# Patient Record
Sex: Female | Born: 1970 | Race: White | Hispanic: No | Marital: Married | Smoking: Never smoker
Health system: Southern US, Community
[De-identification: ages and names within clinical notes are randomized; demographics above are authoritative.]

## PROBLEM LIST (undated history)

## (undated) DIAGNOSIS — E039 Hypothyroidism, unspecified: Secondary | ICD-10-CM

## (undated) DIAGNOSIS — M5126 Other intervertebral disc displacement, lumbar region: Secondary | ICD-10-CM

## (undated) DIAGNOSIS — N189 Chronic kidney disease, unspecified: Secondary | ICD-10-CM

## (undated) DIAGNOSIS — Z87442 Personal history of urinary calculi: Secondary | ICD-10-CM

## (undated) DIAGNOSIS — F32A Depression, unspecified: Secondary | ICD-10-CM

## (undated) DIAGNOSIS — IMO0002 Reserved for concepts with insufficient information to code with codable children: Secondary | ICD-10-CM

## (undated) DIAGNOSIS — F329 Major depressive disorder, single episode, unspecified: Secondary | ICD-10-CM

## (undated) DIAGNOSIS — R002 Palpitations: Principal | ICD-10-CM

## (undated) DIAGNOSIS — F419 Anxiety disorder, unspecified: Secondary | ICD-10-CM

## (undated) DIAGNOSIS — Q600 Renal agenesis, unilateral: Secondary | ICD-10-CM

## (undated) HISTORY — DX: Other intervertebral disc displacement, lumbar region: M51.26

## (undated) HISTORY — DX: Major depressive disorder, single episode, unspecified: F32.9

## (undated) HISTORY — DX: Hypothyroidism, unspecified: E03.9

## (undated) HISTORY — DX: Reserved for concepts with insufficient information to code with codable children: IMO0002

## (undated) HISTORY — DX: Anxiety disorder, unspecified: F41.9

## (undated) HISTORY — DX: Palpitations: R00.2

## (undated) HISTORY — PX: COLONOSCOPY: SHX174

## (undated) HISTORY — DX: Depression, unspecified: F32.A

## (undated) HISTORY — DX: Renal agenesis, unilateral: Q60.0

---

## 2002-07-06 ENCOUNTER — Other Ambulatory Visit: Admission: RE | Admit: 2002-07-06 | Discharge: 2002-07-06 | Payer: Self-pay | Admitting: Obstetrics and Gynecology

## 2002-10-22 ENCOUNTER — Encounter: Payer: Self-pay | Admitting: Obstetrics and Gynecology

## 2002-10-22 ENCOUNTER — Inpatient Hospital Stay (HOSPITAL_COMMUNITY): Admission: AD | Admit: 2002-10-22 | Discharge: 2002-10-22 | Payer: Self-pay | Admitting: Obstetrics and Gynecology

## 2003-01-25 ENCOUNTER — Inpatient Hospital Stay (HOSPITAL_COMMUNITY): Admission: AD | Admit: 2003-01-25 | Discharge: 2003-01-27 | Payer: Self-pay | Admitting: Obstetrics & Gynecology

## 2003-03-06 ENCOUNTER — Other Ambulatory Visit: Admission: RE | Admit: 2003-03-06 | Discharge: 2003-03-06 | Payer: Self-pay | Admitting: Obstetrics and Gynecology

## 2004-04-03 ENCOUNTER — Other Ambulatory Visit: Admission: RE | Admit: 2004-04-03 | Discharge: 2004-04-03 | Payer: Self-pay | Admitting: Obstetrics and Gynecology

## 2005-04-16 ENCOUNTER — Other Ambulatory Visit: Admission: RE | Admit: 2005-04-16 | Discharge: 2005-04-16 | Payer: Self-pay | Admitting: Obstetrics and Gynecology

## 2006-05-24 ENCOUNTER — Encounter: Admission: RE | Admit: 2006-05-24 | Discharge: 2006-05-24 | Payer: Self-pay | Admitting: Obstetrics and Gynecology

## 2006-06-22 ENCOUNTER — Ambulatory Visit (HOSPITAL_COMMUNITY): Admission: RE | Admit: 2006-06-22 | Discharge: 2006-06-22 | Payer: Self-pay | Admitting: Gastroenterology

## 2007-03-08 ENCOUNTER — Encounter: Admission: RE | Admit: 2007-03-08 | Discharge: 2007-03-08 | Payer: Self-pay | Admitting: Obstetrics and Gynecology

## 2008-03-06 ENCOUNTER — Ambulatory Visit (HOSPITAL_COMMUNITY): Admission: RE | Admit: 2008-03-06 | Discharge: 2008-03-06 | Payer: Self-pay | Admitting: Obstetrics and Gynecology

## 2010-10-03 ENCOUNTER — Encounter: Payer: Self-pay | Admitting: Gastroenterology

## 2010-10-04 ENCOUNTER — Encounter: Payer: Self-pay | Admitting: Obstetrics and Gynecology

## 2011-01-26 NOTE — Op Note (Signed)
NAME:  Kristina Sims, Kristina Sims        ACCOUNT NO.:  [id number]   MEDICAL RECORD NO.:  [id number]          PATIENT TYPE:  AMB   LOCATION:  SDC                           FACILITY:  [facility]   PHYSICIAN:  Michelle L. Grewal, M.D.DATE OF BIRTH:  1970/10/01   DATE OF PROCEDURE:  DATE OF DISCHARGE:                               OPERATIVE REPORT   PREOPERATIVE DIAGNOSIS:  Retained intrauterine device.   POSTOPERATIVE DIAGNOSIS:  Retained intrauterine device.   PROCEDURE:  Removal of IUD.   SURGEON:  Michelle L. Vincente Poli, M.D.   ANESTHESIA:  MAC with local.   FINDINGS:  IUD.   PROCEDURE:  The patient was taken to the operating room.  She was  prepped and draped, local was given and paracervical block using 1%  lidocaine without epinephrine.  The cervical internal os was gently  dilated using Pratt dilators.  Polyp forceps were inserted and after the  third attempt, the IUD device was removed.  The strings were coiled  around the device and that is why they were not visualized while in the  office.  All instruments removed from the vagina.  All sponge, lap, and  instrument counts were correct x2.  The patient went to recovery room in  stable condition.      Michelle L. Vincente Poli, M.D.  Electronically Signed     MLG/MEDQ  D:  03/06/2008  T:  03/07/2008  Job:  [id number]

## 2011-06-10 LAB — CBC
HCT: 41.5
Hemoglobin: 14.6
MCHC: 35.1
MCV: 93.2
Platelets: 180
RBC: 4.45
RDW: 12.4
WBC: 5

## 2011-06-10 LAB — PREGNANCY, URINE: Preg Test, Ur: NEGATIVE

## 2013-07-31 ENCOUNTER — Encounter: Payer: Self-pay | Admitting: Gastroenterology

## 2013-08-28 ENCOUNTER — Encounter: Payer: Self-pay | Admitting: Gastroenterology

## 2013-08-28 ENCOUNTER — Ambulatory Visit (INDEPENDENT_AMBULATORY_CARE_PROVIDER_SITE_OTHER): Payer: BC Managed Care – PPO | Admitting: Gastroenterology

## 2013-08-28 VITALS — BP 114/62 | HR 86 | Ht 68.0 in | Wt 141.0 lb

## 2013-08-28 DIAGNOSIS — R141 Gas pain: Secondary | ICD-10-CM

## 2013-08-28 DIAGNOSIS — R140 Abdominal distension (gaseous): Secondary | ICD-10-CM

## 2013-08-28 DIAGNOSIS — R197 Diarrhea, unspecified: Secondary | ICD-10-CM

## 2013-08-28 MED ORDER — MOVIPREP 100 G PO SOLR: 1.0000 | Freq: Once | ORAL | Status: DC

## 2013-08-28 NOTE — Progress Notes (Signed)
HPI: This is a   very pleasant 42 year old woman whom I am meeting for the first time today.  Always since a little girl with GI issues.  Very gassy.  Bloating a lot.  For a very long time.  Tried avoiding dairy in late teens and that made a bit difference. Uses lactaid, other products.  Previously had pyrosis.  After pregnancy with son (16 years ago), more gurgling.  Cramping, loose stools often.  Her PCP suggested gluten free trial (about a year ago).   She had blood work prior to that, was all negative.   Going gluten free has also helped her overall.  At least a sensitivity to gluten clinically.  Can still random attacks of diarrhea, several loose stools in a day.  No sports drinks, no sugar free drinks, no gum or mints.  Sweet tea about once per day.  One mug of coffee.   Very rare nsaids, solitary kidney.  Never blood in her stool.  Jyothi Mann visit in past, eventual [place] which was normal.  Overall stable weight for a long time.  Review of systems: Pertinent positive and negative review of systems were noted in the above HPI section. Complete review of systems was performed and was otherwise normal.    Past Medical History  Diagnosis Date  . Anxiety   . Depression   . Solitary kidney     History reviewed. No pertinent past surgical history.  Current Outpatient Prescriptions  Medication Sig Dispense Refill  . sertraline (ZOLOFT) 100 MG tablet Take 100 mg by mouth daily.       No current facility-administered medications for this visit.    Allergies as of 08/28/2013  . (No Known Allergies)    Family History  Problem Relation Age of Onset  . Diverticulitis Mother     Had resection  . Colon polyps Paternal Grandfather     History   Social History  . Marital Status: Married    Spouse Name: N/A    Number of Children: 2  . Years of Education: N/A   Occupational History  . Research Scientist    Social History Main Topics  . Smoking status: Never  Smoker   . Smokeless tobacco: Never Used  . Alcohol Use: Yes     Comment: glass daily  . Drug Use: No  . Sexual Activity: Not on file   Other Topics Concern  . Not on file   Social History Narrative  . No narrative on file       Physical Exam: BP 114/62  Pulse 86  Ht 5\' 8"  (1.727 m)  Wt 141 lb (63.957 kg)  BMI 21.44 kg/m2  LMP 08/08/2013 Constitutional: generally well-appearing Psychiatric: alert and oriented x3 Eyes: extraocular movements intact Mouth: oral pharynx moist, no lesions Neck: supple no lymphadenopathy Cardiovascular: heart regular rate and rhythm Lungs: clear to auscultation bilaterally Abdomen: soft, nontender, nondistended, no obvious ascites, no peritoneal signs, normal bowel sounds Extremities: no lower extremity edema bilaterally Skin: no lesions on visible extremities    Assessment and plan: 42 y.o. female with  significant bloating, intermittent diarrhea, abdominal discomfort, clinical celiac sprue or at least gluten intolerance, clinical lactose intolerance  She has a lot of bloating. Very bothersome to her now it like to proceed with EGD at her soonest convenience to rule out H. pylori, significant gastritis, we'll also proceed with duodenal biopsies have recommended she consider eating some gluten between now and then to decrease the chance of false negative biopsies.  At the same time are proceed with an colonoscopy given her intermittent diarrhea. We'll get labs sent over from her gynecologist for review.

## 2013-08-28 NOTE — Patient Instructions (Signed)
You will be set up for an upper endoscopy for bloating, dyspepsia, biopsy of duodenum to check for celiac. You will be set up for a colonoscopy for loose stools (LEC, moderate sedation). Try imodium one pill as needed. We will get records of recent labs from Dr. Thana Ates (any recent labs). Consider eating a little gluten before your EGD to decrease chance of false negative.

## 2013-08-29 ENCOUNTER — Encounter: Payer: Self-pay | Admitting: Gastroenterology

## 2013-08-31 ENCOUNTER — Telehealth: Payer: Self-pay | Admitting: Gastroenterology

## 2013-08-31 NOTE — Telephone Encounter (Signed)
Labs from Dr. Vincente Poli: 07/2013: thyroid tests, cmet all normal.   Please call her about the above.  Should continue with the suggestions outlined at recent visit.

## 2013-08-31 NOTE — Telephone Encounter (Signed)
The patient has been notified of this information and all questions answered.

## 2013-09-13 HISTORY — PX: BACK SURGERY: SHX140

## 2013-10-15 ENCOUNTER — Encounter: Payer: BC Managed Care – PPO | Admitting: Gastroenterology

## 2013-10-15 ENCOUNTER — Telehealth: Payer: Self-pay | Admitting: Gastroenterology

## 2013-10-15 NOTE — Telephone Encounter (Signed)
Spoke with patient and she has the flu with a temp of 101.   Greer EeShelia Tellis, RN told Dr. Christella HartiganJacobs.  Patient stated that she will reschedule when she feels better.

## 2013-12-05 ENCOUNTER — Encounter: Payer: BC Managed Care – PPO | Admitting: Gastroenterology

## 2013-12-26 ENCOUNTER — Encounter: Payer: BC Managed Care – PPO | Admitting: Gastroenterology

## 2014-01-28 ENCOUNTER — Telehealth: Payer: Self-pay | Admitting: Gastroenterology

## 2014-01-28 NOTE — Telephone Encounter (Signed)
Left message for pt to call back  °

## 2014-01-28 NOTE — Telephone Encounter (Signed)
Spoke with pt and discussed with her that she should be fine for her procedure since the injection is this week.

## 2014-02-06 ENCOUNTER — Encounter: Payer: Self-pay | Admitting: Gastroenterology

## 2014-02-06 ENCOUNTER — Ambulatory Visit (AMBULATORY_SURGERY_CENTER): Payer: BC Managed Care – PPO | Admitting: Gastroenterology

## 2014-02-06 VITALS — BP 103/66 | HR 66 | Temp 97.7°F | Resp 21 | Ht 68.0 in | Wt 141.0 lb

## 2014-02-06 DIAGNOSIS — R142 Eructation: Principal | ICD-10-CM

## 2014-02-06 DIAGNOSIS — R141 Gas pain: Secondary | ICD-10-CM

## 2014-02-06 DIAGNOSIS — R197 Diarrhea, unspecified: Secondary | ICD-10-CM

## 2014-02-06 DIAGNOSIS — K2990 Gastroduodenitis, unspecified, without bleeding: Secondary | ICD-10-CM

## 2014-02-06 DIAGNOSIS — K2970 Gastritis, unspecified, without bleeding: Secondary | ICD-10-CM

## 2014-02-06 DIAGNOSIS — R143 Flatulence: Principal | ICD-10-CM

## 2014-02-06 MED ORDER — SODIUM CHLORIDE 0.9 % IV SOLN: 500.0000 mL | INTRAVENOUS | Status: DC

## 2014-02-06 NOTE — Op Note (Signed)
[facility] [address] [place], [address]   ENDOSCOPY PROCEDURE REPORT  PATIENT: Kristina, Sims  MR#: [id number] BIRTHDATE: 11-05-1970 , 43  yrs. old GENDER: Female ENDOSCOPIST: Rachael Fee, MD PROCEDURE DATE:  02/06/2014 PROCEDURE:  EGD w/ biopsy ASA CLASS:     Class II INDICATIONS:  bloating, abdominal discomfort. MEDICATIONS: MAC sedation, administered by CRNA and propofol (Diprivan) 200mg  IV TOPICAL ANESTHETIC: none  DESCRIPTION OF PROCEDURE: After the risks benefits and alternatives of the procedure were thoroughly explained, informed consent was obtained.  The LB [id number] endoscope was introduced through the mouth and advanced to the second portion of the duodenum. Without limitations.  The instrument was slowly withdrawn as the mucosa was fully examined.    There was mild, non-specific distal gastritis.  This was biopsied and sent to pathology.  The examination was otherwise normal. Biopsies were taken from duodenum and sent to pathology. Retroflexed views revealed no abnormalities.     The scope was then withdrawn from the patient and the procedure completed. COMPLICATIONS: There were no complications.  ENDOSCOPIC IMPRESSION: There was mild, non-specific distal gastritis.  This was biopsied and sent to pathology.  The examination was otherwise normal. Biopsies were taken from duodenum and sent to pathology.  RECOMMENDATIONS: Await biopsies from stomach and from duodenum.   eSigned:  Rachael Fee, MD 02/06/2014 2:03 PM   CC: Selena Batten, MD

## 2014-02-06 NOTE — Patient Instructions (Signed)
Discharge instructions given with verbal understanding. Biopsies taken. Resume previous medications. YOU HAD AN ENDOSCOPIC PROCEDURE TODAY AT THE [facility]: Refer to the procedure report that was given to you for any specific questions about what was found during the examination.  If the procedure report does not answer your questions, please call your gastroenterologist to clarify.  If you requested that your care partner not be given the details of your procedure findings, then the procedure report has been included in a sealed envelope for you to review at your convenience later.  YOU SHOULD EXPECT: Some feelings of bloating in the abdomen. Passage of more gas than usual.  Walking can help get rid of the air that was put into your GI tract during the procedure and reduce the bloating. If you had a lower endoscopy (such as a colonoscopy or flexible sigmoidoscopy) you may notice spotting of blood in your stool or on the toilet paper. If you underwent a bowel prep for your procedure, then you may not have a normal bowel movement for a few days.  DIET: Your first meal following the procedure should be a light meal and then it is ok to progress to your normal diet.  A half-sandwich or bowl of soup is an example of a good first meal.  Heavy or fried foods are harder to digest and may make you feel nauseous or bloated.  Likewise meals heavy in dairy and vegetables can cause extra gas to form and this can also increase the bloating.  Drink plenty of fluids but you should avoid alcoholic beverages for 24 hours.  ACTIVITY: Your care partner should take you home directly after the procedure.  You should plan to take it easy, moving slowly for the rest of the day.  You can resume normal activity the day after the procedure however you should NOT DRIVE or use heavy machinery for 24 hours (because of the sedation medicines used during the test).    SYMPTOMS TO REPORT IMMEDIATELY: A gastroenterologist  can be reached at any hour.  During normal business hours, 8:30 AM to 5:00 PM Monday through Friday, call [phone].  After hours and on weekends, please call the GI answering service at [phone] who will take a message and have the physician on call contact you.   Following lower endoscopy (colonoscopy or flexible sigmoidoscopy):  Excessive amounts of blood in the stool  Significant tenderness or worsening of abdominal pains  Swelling of the abdomen that is new, acute  Fever of 100F or higher  Following upper endoscopy (EGD)  Vomiting of blood or coffee ground material  New chest pain or pain under the shoulder blades  Painful or persistently difficult swallowing  New shortness of breath  Fever of 100F or higher  Black, tarry-looking stools  FOLLOW UP: If any biopsies were taken you will be contacted by phone or by letter within the next 1-3 weeks.  Call your gastroenterologist if you have not heard about the biopsies in 3 weeks.  Our staff will call the home number listed on your records the next business day following your procedure to check on you and address any questions or concerns that you may have at that time regarding the information given to you following your procedure. This is a courtesy call and so if there is no answer at the home number and we have not heard from you through the emergency physician on call, we will assume that you have returned to your   regular daily activities without incident.  SIGNATURES/CONFIDENTIALITY: You and/or your care partner have signed paperwork which will be entered into your electronic medical record.  These signatures attest to the fact that that the information above on your After Visit Summary has been reviewed and is understood.  Full responsibility of the confidentiality of this discharge information lies with you and/or your care-partner. 

## 2014-02-06 NOTE — Progress Notes (Signed)
Called to room to assist during endoscopic procedure.  Patient ID and intended procedure confirmed with present staff. Received instructions for my participation in the procedure from the performing physician.  

## 2014-02-06 NOTE — Progress Notes (Signed)
Pt stable to RR 

## 2014-02-06 NOTE — Op Note (Signed)
[facility] [address] [place], [address]   COLONOSCOPY PROCEDURE REPORT  PATIENT: Kristina Sims, Kristina Sims  MR#: [id number] BIRTHDATE: Oct 20, 1970 , 43  yrs. old GENDER: Female ENDOSCOPIST: Rachael Fee, MD REFERRED UP:[facility] Corliss Blacker, M.D. PROCEDURE DATE:  02/06/2014 PROCEDURE:   Colonoscopy with biopsy First Screening Colonoscopy - Avg.  risk and is 50 yrs.  old or older - No.  Prior Negative Screening - Now for repeat screening. N/A  History of Adenoma - Now for follow-up colonoscopy & has been > or = to 3 yrs.  N/A  Polyps Removed Today? No.  Recommend repeat exam, <10 yrs? No. ASA CLASS:   Class II INDICATIONS:intermittent diarrhea. MEDICATIONS: MAC sedation, administered by CRNA and propofol (Diprivan) 250mg  IV  DESCRIPTION OF PROCEDURE:   After the risks benefits and alternatives of the procedure were thoroughly explained, informed consent was obtained.  A digital rectal exam revealed no abnormalities of the rectum.   The LB [id number]  endoscope was introduced through the anus and advanced to the terminal ileum which was intubated for a short distance. No adverse events experienced.   The quality of the prep was excellent.  The instrument was then slowly withdrawn as the colon was fully examined.   COLON FINDINGS: The mucosa appeared normal in the terminal ileum. A normal appearing cecum, ileocecal valve, and appendiceal orifice were identified.  The ascending, hepatic flexure, transverse, splenic flexure, descending, sigmoid colon and rectum appeared unremarkable.  No polyps or cancers were seen.  Retroflexed views revealed no abnormalities. The colon was randomly biopsied.The time to cecum=7 minutes 04 seconds.  Withdrawal time=6 minutes 18 seconds.  The scope was withdrawn and the procedure completed. COMPLICATIONS: There were no complications.  ENDOSCOPIC IMPRESSION: 1.   Normal mucosa in the terminal ileum 2.   Normal colon, randomly  biopsied to check for microscopic colitis.  RECOMMENDATIONS: Await final pathology   eSigned:  Rachael Fee, MD 02/06/2014 1:54 PM

## 2014-02-07 ENCOUNTER — Telehealth: Payer: Self-pay | Admitting: *Deleted

## 2014-02-07 NOTE — Telephone Encounter (Signed)
  Follow up Call-  Call back number 02/06/2014  Post procedure Call Back phone  # [phone]  Permission to leave phone message Yes     Patient questions:  Do you have a fever, pain , or abdominal swelling? no Pain Score  0 *  Have you tolerated food without any problems? yes  Have you been able to return to your normal activities? yes  Do you have any questions about your discharge instructions: Diet   no Medications  no Follow up visit  no  Do you have questions or concerns about your Care? no  Actions: * If pain score is 4 or above: No action needed, pain <4.

## 2014-02-12 ENCOUNTER — Encounter: Payer: Self-pay | Admitting: Gastroenterology

## 2014-11-08 ENCOUNTER — Other Ambulatory Visit: Payer: Self-pay | Admitting: Obstetrics and Gynecology

## 2014-11-12 LAB — CYTOLOGY - PAP

## 2015-09-14 DIAGNOSIS — I499 Cardiac arrhythmia, unspecified: Secondary | ICD-10-CM

## 2015-09-14 HISTORY — DX: Cardiac arrhythmia, unspecified: I49.9

## 2015-11-12 ENCOUNTER — Telehealth: Payer: Self-pay | Admitting: Cardiovascular Disease

## 2015-11-12 NOTE — Telephone Encounter (Signed)
Received records from [facility] for appointment on 11/27/15 with Dr Duke Salvia. Records given to [facility] (medical records) for Dr Leonides Sake schedule on 11/27/15.  lp

## 2015-11-27 ENCOUNTER — Encounter: Payer: Self-pay | Admitting: Cardiovascular Disease

## 2015-11-27 ENCOUNTER — Ambulatory Visit (INDEPENDENT_AMBULATORY_CARE_PROVIDER_SITE_OTHER): Payer: BC Managed Care – PPO | Admitting: Cardiovascular Disease

## 2015-11-27 VITALS — BP 110/80 | HR 74 | Ht 68.5 in | Wt 140.3 lb

## 2015-11-27 DIAGNOSIS — R002 Palpitations: Secondary | ICD-10-CM | POA: Insufficient documentation

## 2015-11-27 DIAGNOSIS — E038 Other specified hypothyroidism: Secondary | ICD-10-CM | POA: Diagnosis not present

## 2015-11-27 DIAGNOSIS — E039 Hypothyroidism, unspecified: Secondary | ICD-10-CM | POA: Insufficient documentation

## 2015-11-27 HISTORY — DX: Hypothyroidism, unspecified: E03.9

## 2015-11-27 HISTORY — DX: Palpitations: R00.2

## 2015-11-27 LAB — MAGNESIUM: Magnesium: 2 mg/dL (ref 1.5–2.5)

## 2015-11-27 LAB — BASIC METABOLIC PANEL
BUN: 15 mg/dL (ref 7–25)
CO2: 24 mmol/L (ref 20–31)
Calcium: 9.4 mg/dL (ref 8.6–10.2)
Chloride: 105 mmol/L (ref 98–110)
Creat: 0.91 mg/dL (ref 0.50–1.10)
Glucose, Bld: 86 mg/dL (ref 65–99)
Potassium: 4.6 mmol/L (ref 3.5–5.3)
Sodium: 140 mmol/L (ref 135–146)

## 2015-11-27 NOTE — Patient Instructions (Addendum)
Medication Instructions:  Your physician recommends that you continue on your current medications as directed. Please refer to the Current Medication list given to you today.  [facility]  Testing/Procedures: none  Follow-Up: Your physician wants you to follow-up in: 1 year ov You will receive a reminder letter in the mail two months in advance. If you don't receive a letter, please call our office to schedule the follow-up appointment.  Any Other Special Instructions Will Be Listed Below (If Applicable). Call the office if your palpitations become more frequent and we will schedule you an cardiac event monitor   Your physician has recommended that you wear an event monitor. Event monitors are medical devices that record the heart's electrical activity. Doctors most often us these monitors to diagnose arrhythmias. Arrhythmias are problems with the speed or rhythm of the heartbeat. The monitor is a small, portable device. You can wear one while you do your normal daily activities. This is usually used to diagnose what is causing palpitations/syncope (passing out).  If you need a refill on your cardiac medications before your next appointment, please call your pharmacy.

## 2015-11-27 NOTE — Progress Notes (Signed)
Cardiology Office Note   Date:  11/27/2015   ID:  [name], DOB November 08, 1970, MRN [id number]  PCP:  Gweneth Dimitri, MD  Cardiologist:   Madilyn Hook, MD   Chief Complaint  Patient presents with  . New Evaluation    feels as if heart is "skipping beats"      History of Present Illness: Kristina Sims is a 45 y.o. female with hypothyroidism, anxiety and depression who presents for an evaluation of palpitations.  Kristina Sims saw her PCP, Dr. Maurice Small, on 10/22/15.  At that appointment she reported daily palpitations.  At that appointment her thyroid function was normal and she was not anemic.  EKG showed sinus rhythm.  She reports episodes  of palpitations that started this summer.  They occur sporadically but tend to occur more frequently at night when she lays down in the bed. They do not occur with exertion. She gets short of breath when happens but denies any associated chest pain, lightheadedness, dizziness, nausea or diaphoresis. She notes that she has been under more stress lately as her son, father in law, and husband all have polycystic kidney disease. Her husband will likely require a kidney transplant soon. Her father was also diagnosed with atrial fibrillation this summer and her mother has dementia. She is concerned that she too may have atrial fibrillation as her father recounts having episodes of palpitations for years but just now became sustained.   Kristina Sims has tried to reduce her caffeine intake. She thinks that this may have helped, and she is not having symptoms as frequently anymore. The last episode was 2 weeks ago. This summer she was having them almost daily and she had more frequent episodes over the New Year's holiday.  Exercises 4 times a week either using the elliptical or in a spin class. She  does not have any exertional symptoms. She has not noted any lower extremity edema, orthopnea or PND. She does note some left  supraclavicular chest pain that never occurs with exertion. She thinks it is related to musculoskeletal tenderness from exercise.  She reports having a healthy diet and her cholesterol levels were normal when checked with her OB/GYN recently.   Past Medical History  Diagnosis Date  . Anxiety   . Depression   . Solitary kidney   . RLD (ruptured lumbar disc)   . Palpitations 11/27/2015  . Hypothyroidism 11/27/2015    No past surgical history on file.   Current Outpatient Prescriptions  Medication Sig Dispense Refill  . sertraline (ZOLOFT) 100 MG tablet Take 100 mg by mouth daily.     No current facility-administered medications for this visit.    Allergies:   Review of patient's allergies indicates no known allergies.    Social History:  The patient  reports that she has never smoked. She has never used smokeless tobacco. She reports that she drinks alcohol. She reports that she does not use illicit drugs.   Family History:  The patient's family history includes Atrial fibrillation in her father; Colon polyps in her paternal grandfather; Dementia in her mother; Diverticulitis in her mother.    ROS:  Please see the history of present illness.   Otherwise, review of systems are positive for none.   All other systems are reviewed and negative.    PHYSICAL EXAM: VS:  BP 110/80 mmHg  Pulse 74  Ht 5' 8.5" (1.74 m)  Wt 63.64 kg (140 lb 4.8 oz)  BMI 21.02 kg/m2 , BMI Body  mass index is 21.02 kg/(m^2). GENERAL:  Well appearing HEENT:  Pupils equal round and reactive, fundi not visualized, oral mucosa unremarkable NECK:  No jugular venous distention, waveform within normal limits, carotid upstroke brisk and symmetric, no bruits, no thyromegaly LYMPHATICS:  No cervical adenopathy LUNGS:  Clear to auscultation bilaterally HEART:  RRR.  PMI not displaced or sustained,S1 and S2 within normal limits, no S3, no S4, no clicks, no rubs, no murmurs ABD:  Flat, positive bowel sounds normal in  frequency in pitch, no bruits, no rebound, no guarding, no midline pulsatile mass, no hepatomegaly, no splenomegaly EXT:  2 plus pulses throughout, no edema, no cyanosis no clubbing SKIN:  No rashes no nodules NEURO:  Cranial nerves II through XII grossly intact, motor grossly intact throughout PSYCH:  Cognitively intact, oriented to person place and time    EKG:  EKG is ordered today. The ekg ordered today demonstratessinus rhythm. Rate 74 bpm.  Recent Labs: No results found for requested labs within last 365 days.   10/22/15:  TSH 0.74 Hgb 13.0   Lipid Panel No results found for: CHOL, TRIG, HDL, CHOLHDL, VLDL, LDLCALC, LDLDIRECT    Wt Readings from Last 3 Encounters:  11/27/15 63.64 kg (140 lb 4.8 oz)  02/06/14 63.957 kg (141 lb)  08/28/13 63.957 kg (141 lb)      ASSESSMENT AND PLAN:  # Palpitations: Kristina Sims's palpitations are likely PVCs or PACs. However, given her family history of atrial fibrillation that may have started at a young age, this is possible too. She is not having them frequently now. She prefers to wear an ambulatory monitor when the episodes are occurring more frequently. She will continue to limit her caffeine intake as this seems to have improved her symptoms. Hemoglobin and thyroid function were within normal limits. We will check a basic metabolic panel and a magnesium level to ensure her electrolytes are stable.    Current medicines are reviewed at length with the patient today.  The patient does not have concerns regarding medicines.  The following changes have been made:  no change  Labs/ tests ordered today include:   Orders Placed This Encounter  Procedures  . Basic metabolic panel  . Magnesium  . EKG 12-Lead     Disposition:   FU with Ivyonna Hoelzel C. Duke Salviaandolph, MD, [facility] in 1 year    This note was written with the assistance of speech recognition software.  Please excuse any transcriptional errors.  Signed, Shanan Mcmiller C. Duke Salviaandolph,  MD, [facility]  11/27/2015 9:16 AM    [facility]

## 2015-12-08 ENCOUNTER — Telehealth: Payer: Self-pay | Admitting: Cardiovascular Disease

## 2015-12-08 NOTE — Telephone Encounter (Signed)
F/u  Pt returning RN phone call. Please call back and discuss.   

## 2015-12-08 NOTE — Telephone Encounter (Signed)
Lab results reported, pt verbalized understanding.

## 2016-03-29 ENCOUNTER — Telehealth: Payer: Self-pay | Admitting: Cardiovascular Disease

## 2016-03-29 DIAGNOSIS — R002 Palpitations: Secondary | ICD-10-CM

## 2016-03-29 NOTE — Telephone Encounter (Signed)
Spoke with patient and scheduled event monitor for [facility] Patient aware of date and time

## 2016-03-29 NOTE — Telephone Encounter (Signed)
New message      The pt starting having a fast heart and was instructed to call and come by the office and the office would place the pt on a heart monitor.

## 2016-03-30 ENCOUNTER — Ambulatory Visit (INDEPENDENT_AMBULATORY_CARE_PROVIDER_SITE_OTHER): Payer: BC Managed Care – PPO

## 2016-03-30 DIAGNOSIS — R002 Palpitations: Secondary | ICD-10-CM

## 2016-05-24 ENCOUNTER — Telehealth: Payer: Self-pay | Admitting: Cardiovascular Disease

## 2016-05-24 NOTE — Telephone Encounter (Signed)
Returning your call from Thursday. °

## 2016-05-26 NOTE — Telephone Encounter (Signed)
Left message to call back  Did leave detailed message last week   Notes Recorded by Chilton Siiffany [place], MD on 05/19/2016 at 5:33 PM EDT Normal monitor

## 2016-05-26 NOTE — Telephone Encounter (Signed)
Advised patient of results.  

## 2017-06-15 ENCOUNTER — Other Ambulatory Visit: Payer: Self-pay | Admitting: Family Medicine

## 2017-06-15 DIAGNOSIS — R1011 Right upper quadrant pain: Secondary | ICD-10-CM

## 2017-06-21 ENCOUNTER — Ambulatory Visit
Admission: RE | Admit: 2017-06-21 | Discharge: 2017-06-21 | Disposition: A | Discharge status: Home / Self Care | Payer: BC Managed Care – PPO | Source: Ambulatory Visit | Attending: Family Medicine | Admitting: Family Medicine

## 2017-06-21 DIAGNOSIS — R1011 Right upper quadrant pain: Secondary | ICD-10-CM

## 2019-11-23 ENCOUNTER — Ambulatory Visit: Payer: BC Managed Care – PPO | Attending: Internal Medicine

## 2019-11-23 DIAGNOSIS — Z23 Encounter for immunization: Secondary | ICD-10-CM

## 2019-11-23 NOTE — Progress Notes (Signed)
   Covid-19 Vaccination Clinic  Name:  Kristina Sims    MRN: [id number] DOB: 1970/10/29  11/23/2019  Kristina Sims was observed post Covid-19 immunization for 15 minutes without incident. She was provided with Vaccine Information Sheet and instruction to access the V-Safe system.   Kristina Sims was instructed to call 911 with any severe reactions post vaccine: Marland Kitchen Difficulty breathing  . Swelling of face and throat  . A fast heartbeat  . A bad rash all over body  . Dizziness and weakness   Immunizations Administered    Name Date Dose VIS Date Route   Pfizer COVID-19 Vaccine 11/23/2019  4:33 PM 0.3 mL 08/24/2019 Intramuscular   Manufacturer: [employer], [employer]   Lot: [id number]   NDC: [address]-5

## 2019-12-17 ENCOUNTER — Ambulatory Visit: Payer: BC Managed Care – PPO

## 2019-12-17 ENCOUNTER — Ambulatory Visit: Payer: BC Managed Care – PPO | Attending: Internal Medicine

## 2019-12-17 DIAGNOSIS — Z23 Encounter for immunization: Secondary | ICD-10-CM

## 2019-12-17 NOTE — Progress Notes (Signed)
   Covid-19 Vaccination Clinic  Name:  Kristina Sims    MRN: [id number] DOB: 12/08/1970  12/17/2019  [name] was observed post Covid-19 immunization for 15 minutes without incident. She was provided with Vaccine Information Sheet and instruction to access the V-Safe system.   [name] was instructed to call 911 with any severe reactions post vaccine: Marland Kitchen Difficulty breathing  . Swelling of face and throat  . A fast heartbeat  . A bad rash all over body  . Dizziness and weakness   Immunizations Administered    Name Date Dose VIS Date Route   Pfizer COVID-19 Vaccine 12/17/2019 12:55 PM 0.3 mL 08/24/2019 Intramuscular   Manufacturer: [employer], [employer]   Lot: [id number]   NDC: [address]-2

## 2020-12-04 ENCOUNTER — Emergency Department (HOSPITAL_BASED_OUTPATIENT_CLINIC_OR_DEPARTMENT_OTHER): Payer: BLUE CROSS/BLUE SHIELD

## 2020-12-04 ENCOUNTER — Emergency Department (HOSPITAL_BASED_OUTPATIENT_CLINIC_OR_DEPARTMENT_OTHER)
Admission: EM | Admit: 2020-12-04 | Discharge: 2020-12-04 | Disposition: A | Discharge status: Home / Self Care | Payer: BLUE CROSS/BLUE SHIELD | Attending: Emergency Medicine | Admitting: Emergency Medicine

## 2020-12-04 ENCOUNTER — Encounter (HOSPITAL_BASED_OUTPATIENT_CLINIC_OR_DEPARTMENT_OTHER): Payer: Self-pay

## 2020-12-04 ENCOUNTER — Other Ambulatory Visit: Payer: Self-pay

## 2020-12-04 DIAGNOSIS — E039 Hypothyroidism, unspecified: Secondary | ICD-10-CM | POA: Insufficient documentation

## 2020-12-04 DIAGNOSIS — R109 Unspecified abdominal pain: Secondary | ICD-10-CM | POA: Diagnosis present

## 2020-12-04 DIAGNOSIS — N201 Calculus of ureter: Secondary | ICD-10-CM | POA: Diagnosis not present

## 2020-12-04 LAB — COMPREHENSIVE METABOLIC PANEL
ALT: 12 U/L (ref 0–44)
AST: 16 U/L (ref 15–41)
Albumin: 4.5 g/dL (ref 3.5–5.0)
Alkaline Phosphatase: 47 U/L (ref 38–126)
Anion gap: 8 (ref 5–15)
BUN: 11 mg/dL (ref 6–20)
CO2: 26 mmol/L (ref 22–32)
Calcium: 9.1 mg/dL (ref 8.9–10.3)
Chloride: 105 mmol/L (ref 98–111)
Creatinine, Ser: 0.85 mg/dL (ref 0.44–1.00)
GFR, Estimated: >60 mL/min (ref >60)
Glucose, Bld: 89 mg/dL (ref 70–99)
Potassium: 3.7 mmol/L (ref 3.5–5.1)
Sodium: 139 mmol/L (ref 135–145)
Total Bilirubin: 0.7 mg/dL (ref 0.3–1.2)
Total Protein: 6.6 g/dL (ref 6.5–8.1)

## 2020-12-04 LAB — CBC WITH DIFFERENTIAL/PLATELET
Abs Immature Granulocytes: 0.01 10*3/uL (ref 0.00–0.07)
Basophils Absolute: 0.1 10*3/uL (ref 0.0–0.1)
Basophils Relative: 1 %
Eosinophils Absolute: 0 10*3/uL (ref 0.0–0.5)
Eosinophils Relative: 0 %
HCT: 42.9 % (ref 36.0–46.0)
Hemoglobin: 14.2 g/dL (ref 12.0–15.0)
Immature Granulocytes: 0 %
Lymphocytes Relative: 25 %
Lymphs Abs: 1.7 10*3/uL (ref 0.7–4.0)
MCH: 32.6 pg (ref 26.0–34.0)
MCHC: 33.1 g/dL (ref 30.0–36.0)
MCV: 98.4 fL (ref 80.0–100.0)
Monocytes Absolute: 0.6 10*3/uL (ref 0.1–1.0)
Monocytes Relative: 8 %
Neutro Abs: 4.7 10*3/uL (ref 1.7–7.7)
Neutrophils Relative %: 66 %
Platelets: 257 10*3/uL (ref 150–400)
RBC: 4.36 MIL/uL (ref 3.87–5.11)
RDW: 12.3 % (ref 11.5–15.5)
WBC: 7 10*3/uL (ref 4.0–10.5)
nRBC: 0 % (ref 0.0–0.2)

## 2020-12-04 LAB — URINALYSIS, ROUTINE W REFLEX MICROSCOPIC
Bilirubin Urine: NEGATIVE
Glucose, UA: NEGATIVE mg/dL
Ketones, ur: NEGATIVE mg/dL
Leukocytes,Ua: NEGATIVE
Nitrite: NEGATIVE
Protein, ur: NEGATIVE mg/dL
RBC / HPF: >50 RBC/hpf — ABNORMAL HIGH (ref 0–5)
Specific Gravity, Urine: 1.002 — ABNORMAL LOW (ref 1.005–1.030)
pH: 7 (ref 5.0–8.0)

## 2020-12-04 LAB — PREGNANCY, URINE: Preg Test, Ur: NEGATIVE

## 2020-12-04 LAB — LIPASE, BLOOD: Lipase: 33 U/L (ref 11–51)

## 2020-12-04 MED ORDER — ONDANSETRON 4 MG PO TBDP: 4.0000 mg | ORAL_TABLET | Freq: Three times a day (TID) | ORAL | 0 refills | Status: AC | PRN

## 2020-12-04 MED ORDER — KETOROLAC TROMETHAMINE 15 MG/ML IJ SOLN
15.0000 mg | Freq: Once | INTRAMUSCULAR | Status: AC
  Administered 2020-12-04: 15 mg via INTRAVENOUS
  Filled 2020-12-04: qty 1

## 2020-12-04 MED ORDER — TAMSULOSIN HCL 0.4 MG PO CAPS: 0.4000 mg | ORAL_CAPSULE | Freq: Every day | ORAL | 0 refills | Status: AC

## 2020-12-04 MED ORDER — OXYCODONE-ACETAMINOPHEN 5-325 MG PO TABS: 1.0000 | ORAL_TABLET | Freq: Four times a day (QID) | ORAL | 0 refills | Status: AC | PRN

## 2020-12-04 MED ORDER — LACTATED RINGERS IV BOLUS
1000.0000 mL | Freq: Once | INTRAVENOUS | Status: AC
  Administered 2020-12-04: 1000 mL via INTRAVENOUS

## 2020-12-04 NOTE — ED Provider Notes (Signed)
[facility] EMERGENCY DEPT Provider Note   CSN: [id number] Arrival date & time: 12/04/20  1219     History Chief Complaint  Patient presents with  . Flank Pain    Left    Kristina Sims is a 50 y.o. female.   Flank Pain This is a recurrent problem. The current episode started 3 to 5 hours ago. The problem occurs constantly. The problem has not changed since onset.Associated symptoms include abdominal pain (cva). Pertinent negatives include no chest pain, no headaches and no shortness of breath. Nothing aggravates the symptoms. Nothing relieves the symptoms. She has tried nothing for the symptoms. The treatment provided no relief.       Past Medical History:  Diagnosis Date  . Anxiety   . Depression   . Hypothyroidism 11/27/2015  . Palpitations 11/27/2015  . RLD (ruptured lumbar disc)   . Solitary kidney     Patient Active Problem List   Diagnosis Date Noted  . Palpitations 11/27/2015  . Hypothyroidism 11/27/2015    History reviewed. No pertinent surgical history.   OB History   No obstetric history on file.     Family History  Problem Relation Age of Onset  . Diverticulitis Mother        Had resection  . Dementia Mother   . Bradycardia Mother   . Colon polyps Paternal Grandfather   . Atrial fibrillation Father   . Thyroid disease Father   . Peripheral Artery Disease Maternal Grandmother   . Alzheimer's disease Maternal Grandfather     Social History   Tobacco Use  . Smoking status: Never Smoker  . Smokeless tobacco: Never Used  Substance Use Topics  . Alcohol use: Yes    Comment: glass daily  . Drug use: No    Home Medications Prior to Admission medications   Medication Sig Start Date End Date Taking? Authorizing Provider  ondansetron (ZOFRAN ODT) 4 MG disintegrating tablet Take 1 tablet (4 mg total) by mouth every 8 (eight) hours as needed for up to 10 doses for nausea or vomiting. 12/04/20  Yes Sabino Donovan, MD   oxyCODONE-acetaminophen (PERCOCET/ROXICET) 5-325 MG tablet Take 1 tablet by mouth every 6 (six) hours as needed for up to 12 days for severe pain. 12/04/20 12/16/20 Yes Sabino Donovan, MD  tamsulosin (FLOMAX) 0.4 MG CAPS capsule Take 1 capsule (0.4 mg total) by mouth daily for 5 days. 12/04/20 12/09/20 Yes Sabino Donovan, MD  estradiol (VIVELLE-DOT) 0.05 MG/24HR patch Place 1 patch onto the skin 2 (two) times a week. 11/30/20   [provider]  progesterone (PROMETRIUM) 100 MG capsule Take 200 mg by mouth daily. 10/22/20   [provider]  sertraline (ZOLOFT) 100 MG tablet Take 100 mg by mouth daily.    [provider]    Allergies    Patient has no known allergies.  Review of Systems   Review of Systems  Respiratory: Negative for shortness of breath.   Cardiovascular: Negative for chest pain.  Gastrointestinal: Positive for abdominal pain (cva).  Genitourinary: Positive for flank pain and hematuria. Negative for difficulty urinating, dysuria and enuresis.  Neurological: Negative for headaches.    Physical Exam Updated Vital Signs BP 111/68   Pulse 75   Temp 98.1 F (36.7 C) (Oral)   Resp 15   Ht 5\' 8"  (1.727 m)   Wt 65.8 kg   SpO2 100%   BMI 22.05 kg/m   Physical Exam Vitals and nursing note reviewed. Exam conducted with  a chaperone present.  Constitutional:      General: She is not in acute distress.    Appearance: Normal appearance.  HENT:     Head: Normocephalic and atraumatic.     Nose: No rhinorrhea.     Mouth/Throat:     Mouth: Mucous membranes are moist.  Eyes:     General:        Right eye: No discharge.        Left eye: No discharge.     Conjunctiva/sclera: Conjunctivae normal.  Cardiovascular:     Rate and Rhythm: Normal rate and regular rhythm.  Pulmonary:     Effort: Pulmonary effort is normal. No respiratory distress.     Breath sounds: No stridor.  Abdominal:     General: Abdomen is flat. There is no distension.     Palpations:  Abdomen is soft.     Tenderness: There is no abdominal tenderness. There is no right CVA tenderness, left CVA tenderness, guarding or rebound.  Musculoskeletal:        General: No tenderness or signs of injury.  Skin:    General: Skin is warm and dry.     Capillary Refill: Capillary refill takes less than 2 seconds.  Neurological:     General: No focal deficit present.     Mental Status: She is alert. Mental status is at baseline.     Motor: No weakness.  Psychiatric:        Mood and Affect: Mood normal.        Behavior: Behavior normal.     ED Results / Procedures / Treatments   Labs (all labs ordered are listed, but only abnormal results are displayed) Labs Reviewed  URINALYSIS, ROUTINE W REFLEX MICROSCOPIC - Abnormal; Notable for the following components:      Result Value   Color, Urine COLORLESS (*)    Specific Gravity, Urine 1.002 (*)    Hgb urine dipstick LARGE (*)    RBC / HPF >50 (*)    Bacteria, UA RARE (*)    All other components within normal limits  CBC WITH DIFFERENTIAL/PLATELET  COMPREHENSIVE METABOLIC PANEL  LIPASE, BLOOD  PREGNANCY, URINE    EKG None  Radiology CT Renal Stone Study  Result Date: 12/04/2020 CLINICAL DATA:  Left flank pain EXAM: CT ABDOMEN AND PELVIS WITHOUT CONTRAST TECHNIQUE: Multidetector CT imaging of the abdomen and pelvis was performed following the standard protocol without oral or IV contrast. COMPARISON:  None. FINDINGS: Lower chest: Lung bases are clear. Hepatobiliary: Liver measures 20.5 cm in length. 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 No focal liver lesions are evident on this noncontrast enhanced study. Gallbladder wall is not appreciably thickened. There is no evident biliary duct dilatation. Pancreas: There is no pancreatic mass or inflammatory focus. Spleen: No splenic lesions are evident. Adrenals/Urinary Tract: Adrenals bilaterally appear normal. Absent right kidney. There is apparent compensatory hypertrophy of  the left kidney. No left renal mass. There is mild hydronephrosis on the left. There is no intrarenal calculus on the left. There is a focal calculus at the left ureteropelvic junction measuring 6 x 5 mm. No other ureteral calculus is evident. Urinary bladder is midline with wall thickness within normal limits. Stomach/Bowel: There is no appreciable bowel wall or mesenteric thickening. No evident bowel obstruction. Terminal ileum appears normal. Appendix region appears normal. No evident free air or portal venous air. Vascular/Lymphatic: There is no abdominal aortic aneurysm. No vascular lesions are appreciable on this noncontrast enhanced  study. There is no appreciable adenopathy in the abdomen or pelvis. Reproductive: The uterus is anteverted. No adnexal masses are evident. Other: No evident abscess or ascites in the abdomen or pelvis. Musculoskeletal: No blastic or lytic bone lesions. No intramuscular or abdominal IMPRESSION: 1. 6 x 5 mm calculus at the left ureteropelvic junction with mild hydronephrosis on the left. 2. Absent right kidney. Apparent compensatory hypertrophy left kidney. 3.  Liver mildly prominent without focal liver lesion evident. 4. No bowel wall thickening or bowel obstruction. No abscess in the abdomen or pelvis. No appendiceal region inflammation. Electronically Signed   By: Bretta Bang III M.D.   On: 12/04/2020 14:08    Procedures Procedures   Medications Ordered in ED Medications  ketorolac (TORADOL) 15 MG/ML injection 15 mg (15 mg Intravenous Given 12/04/20 1259)  lactated ringers bolus 1,000 mL ( Intravenous Stopped 12/04/20 1414)    ED Course  I have reviewed the triage vital signs and the nursing notes.  Pertinent labs & imaging results that were available during my care of the patient were reviewed by me and considered in my medical decision making (see chart for details).    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  History of renal stones, has pain consistent with ureteral stone hematuria.  No fevers chills nausea vomiting.  Benign abdominal exam.  Will get imaging labs and reassess.  Toradol given for pain.  Pain is well controlled.  Patient is got a 6 x 5 mm stone.  Based on size likelihood of passing is high.  No infectious signs or symptoms.  Other labs fairly unremarkable after my review.  Safe for outpatient management.  After my review of all imaging and lab studies I feel she is safe.  She feels this as well.  Return precautions discussed medications prescribed for symptom control filter provided outpatient urology recommended  Final Clinical Impression(s) / ED Diagnoses Final diagnoses:  Left ureteral stone    Rx / DC Orders ED Discharge Orders         Ordered    tamsulosin (FLOMAX) 0.4 MG CAPS capsule  Daily        12/04/20 1425    oxyCODONE-acetaminophen (PERCOCET/ROXICET) 5-325 MG tablet  Every 6 hours PRN        12/04/20 1425    ondansetron (ZOFRAN ODT) 4 MG disintegrating tablet  Every 8 hours PRN        12/04/20 1425           Sabino Donovan, MD 12/04/20 1427

## 2020-12-04 NOTE — ED Triage Notes (Signed)
Patient here with complaints of left flank pain for the past few days, started urinating blood today. Knows she has an existing kidney stone and thinks it may be moving, only has a left kidney. Is followed by urology and was told to come here today because they could not get her in today.

## 2020-12-04 NOTE — Discharge Instructions (Addendum)
Take 600 mg of ibuprofen every 6 hours for pain control and to help pass the stone.  Stay well-hydrated.  Return to [place] with symptoms of urinary tract infection fever chills or inability to tolerate pain or hydration.  Follow-up with your urologist.

## 2020-12-11 ENCOUNTER — Other Ambulatory Visit: Payer: Self-pay | Admitting: Urology

## 2020-12-11 NOTE — Patient Instructions (Signed)
DUE TO COVID-19 ONLY ONE VISITOR IS ALLOWED TO COME WITH YOU AND STAY IN THE WAITING ROOM ONLY DURING PRE OP AND PROCEDURE DAY OF SURGERY. THE 1 VISITOR  MAY VISIT WITH YOU AFTER SURGERY IN YOUR PRIVATE ROOM DURING VISITING HOURS ONLY!  YOU NEED TO HAVE A COVID 19 TEST ON_4/6______ @__3 :00 PM_____, THIS TEST MUST BE DONE BEFORE SURGERY,  COVID TESTING SITE [address] [place] , IT IS ON THE RIGHT GOING OUT [address] APPROXIMATELY  2 MINUTES PAST [employer] ON THE RIGHT. ONCE YOUR COVID TEST IS COMPLETED,  PLEASE BEGIN THE QUARANTINE INSTRUCTIONS AS OUTLINED IN YOUR HANDOUT.                Kristina Sims   Your procedure is scheduled on: 12/19/20   Report to [facility] Main  Entrance   Report to admitting at 9:00 AM     Call this number if you have problems the morning of surgery [phone]    Remember: Do not eat food or drink liquids :After Midnight.   BRUSH YOUR TEETH MORNING OF SURGERY AND RINSE YOUR MOUTH OUT, NO CHEWING GUM CANDY OR MINTS.     Take these medicines the morning of surgery with A SIP OF WATER: Zoloft                                 You may not have any metal on your body including hair pins and              piercings  Do not wear jewelry, make-up, lotions, powders or perfumes, deodorant             Do not wear nail polish on your fingernails.  Do not shave  48 hours prior to surgery.     Do not bring valuables to the hospital. [facility] IS NOT             RESPONSIBLE   FOR VALUABLES.  Contacts, dentures or bridgework may not be worn into surgery. .     Patients discharged the day of surgery will not be allowed to drive home.   IF YOU ARE HAVING SURGERY AND GOING HOME THE SAME DAY, YOU MUST HAVE AN ADULT TO DRIVE YOU HOME AND BE WITH YOU FOR 24 HOURS.   YOU MAY GO HOME BY TAXI OR UBER OR ORTHERWISE, BUT AN ADULT MUST ACCOMPANY YOU HOME AND STAY WITH YOU FOR 24 HOURS.  Name and phone number of your  driver:  Special Instructions: N/A              Please read over the following fact sheets you were given: _____________________________________________________________________             [facility] - Preparing for Surgery Before surgery, you can play an important role.  Because skin is not sterile, your skin needs to be as free of germs as possible.  You can reduce the number of germs on your skin by washing with CHG (chlorahexidine gluconate) soap before surgery.  CHG is an antiseptic cleaner which kills germs and bonds with the skin to continue killing germs even after washing. Please DO NOT use if you have an allergy to CHG or antibacterial soaps.  If your skin becomes reddened/irritated stop using the CHG and inform your nurse when you arrive at Short Stay. Do not shave (including legs and underarms) for at  least 48 hours prior to the first CHG shower.  Please follow these instructions carefully:  1.  Shower with CHG Soap the night before surgery and the  morning of Surgery.  2.  If you choose to wash your hair, wash your hair first as usual with your  normal  shampoo.  3.  After you shampoo, rinse your hair and body thoroughly to remove the  shampoo.                                        4.  Use CHG as you would any other liquid soap.  You can apply chg directly  to the skin and wash                       Gently with a scrungie or clean washcloth.  5.  Apply the CHG Soap to your body ONLY FROM THE NECK DOWN.   Do not use on face/ open                           Wound or open sores. Avoid contact with eyes, ears mouth and genitals (private parts).                       Wash face,  Genitals (private parts) with your normal soap.             6.  Wash thoroughly, paying special attention to the area where your surgery  will be performed.  7.  Thoroughly rinse your body with warm water from the neck down.  8.  DO NOT shower/wash with your normal soap after using and rinsing off  the CHG  Soap.             9.  Pat yourself dry with a clean towel.            10.  Wear clean pajamas.            11.  Place clean sheets on your bed the night of your first shower and do not  sleep with pets. Day of Surgery : Do not apply any lotions/deodorants the morning of surgery.  Please wear clean clothes to the hospital/surgery center.  FAILURE TO FOLLOW THESE INSTRUCTIONS MAY RESULT IN THE CANCELLATION OF YOUR SURGERY PATIENT SIGNATURE_________________________________  NURSE SIGNATURE__________________________________  ________________________________________________________________________

## 2020-12-16 ENCOUNTER — Other Ambulatory Visit: Payer: Self-pay

## 2020-12-16 ENCOUNTER — Encounter (HOSPITAL_COMMUNITY): Payer: Self-pay

## 2020-12-16 ENCOUNTER — Encounter (HOSPITAL_COMMUNITY)
Admission: RE | Admit: 2020-12-16 | Discharge: 2020-12-16 | Disposition: A | Discharge status: Home / Self Care | Payer: BLUE CROSS/BLUE SHIELD | Source: Ambulatory Visit | Attending: Urology | Admitting: Urology

## 2020-12-16 DIAGNOSIS — Q600 Renal agenesis, unilateral: Secondary | ICD-10-CM | POA: Diagnosis not present

## 2020-12-16 DIAGNOSIS — Z01812 Encounter for preprocedural laboratory examination: Secondary | ICD-10-CM | POA: Insufficient documentation

## 2020-12-16 DIAGNOSIS — N202 Calculus of kidney with calculus of ureter: Secondary | ICD-10-CM | POA: Diagnosis not present

## 2020-12-16 DIAGNOSIS — Z20822 Contact with and (suspected) exposure to covid-19: Secondary | ICD-10-CM | POA: Diagnosis not present

## 2020-12-16 HISTORY — DX: Chronic kidney disease, unspecified: N18.9

## 2020-12-16 HISTORY — DX: Personal history of urinary calculi: Z87.442

## 2020-12-16 LAB — CBC
HCT: 42.4 % (ref 36.0–46.0)
Hemoglobin: 14.3 g/dL (ref 12.0–15.0)
MCH: 32.7 pg (ref 26.0–34.0)
MCHC: 33.7 g/dL (ref 30.0–36.0)
MCV: 97 fL (ref 80.0–100.0)
Platelets: 217 10*3/uL (ref 150–400)
RBC: 4.37 MIL/uL (ref 3.87–5.11)
RDW: 11.9 % (ref 11.5–15.5)
WBC: 5.7 10*3/uL (ref 4.0–10.5)
nRBC: 0 % (ref 0.0–0.2)

## 2020-12-16 LAB — BASIC METABOLIC PANEL
Anion gap: 8 (ref 5–15)
BUN: 11 mg/dL (ref 6–20)
CO2: 26 mmol/L (ref 22–32)
Calcium: 9.6 mg/dL (ref 8.9–10.3)
Chloride: 107 mmol/L (ref 98–111)
Creatinine, Ser: 0.88 mg/dL (ref 0.44–1.00)
GFR, Estimated: >60 mL/min (ref >60)
Glucose, Bld: 99 mg/dL (ref 70–99)
Potassium: 4.2 mmol/L (ref 3.5–5.1)
Sodium: 141 mmol/L (ref 135–145)

## 2020-12-16 NOTE — Progress Notes (Signed)
COVID Vaccine Completed:Yes Date COVID Vaccine completed:12/17/19-booster 09/02/20 COVID vaccine manufacturer: Pfizer      PCP - Dr. Nicholes Mango Cardiologist - Dr. Lavell Islam  Chest x-ray - no EKG - 12/16/20 Stress Test - no ECHO - no Cardiac Cath - NA Pacemaker/ICD device last checked:NA  Sleep Study - no CPAP -   Fasting Blood Sugar - NA Checks Blood Sugar _____ times a day  Blood Thinner Instructions:NA Aspirin Instructions: Last Dose:  Anesthesia review:   Patient denies shortness of breath, fever, cough and chest pain at PAT appointment yes  Patient verbalized understanding of instructions that were given to them at the PAT appointment. Patient was also instructed that they will need to review over the PAT instructions again at home before surgery.yes Pt has no SOB with any activities. She does have a solitary kidney.

## 2020-12-17 ENCOUNTER — Other Ambulatory Visit (HOSPITAL_COMMUNITY)
Admission: RE | Admit: 2020-12-17 | Discharge: 2020-12-17 | Disposition: A | Discharge status: Home / Self Care | Payer: BLUE CROSS/BLUE SHIELD | Source: Ambulatory Visit | Attending: Urology | Admitting: Urology

## 2020-12-17 DIAGNOSIS — Z01812 Encounter for preprocedural laboratory examination: Secondary | ICD-10-CM | POA: Insufficient documentation

## 2020-12-17 DIAGNOSIS — N202 Calculus of kidney with calculus of ureter: Secondary | ICD-10-CM | POA: Diagnosis not present

## 2020-12-17 DIAGNOSIS — Z20822 Contact with and (suspected) exposure to covid-19: Secondary | ICD-10-CM | POA: Insufficient documentation

## 2020-12-17 LAB — SARS CORONAVIRUS 2 (TAT 6-24 HRS): SARS Coronavirus 2: NEGATIVE

## 2020-12-18 NOTE — H&P (Signed)
cc: Urolithiasis   HPI: 50 year old female with a solitary left kidney who presents today with concerns of a 5 mm UPJ stone. She reports she was experiencing some back pain and discomfort on 12/04/2020 and proceeded to the emergency department where a CT scan was performed. They noted her to have a nonobstructing left UPJ stone. She presents today to follow-up on this. She reports that all of her pain has resolved. She denies fevers and chills. She denies flank tenderness. She denies gross hematuria. She has not had any changes to her voiding habits     ALLERGIES: No Allergies    MEDICATIONS: Estradiol (Twice Weekly)  Progesterone 200 mg capsule  Sertraline Hcl 50 mg tablet Oral     GU PSH: No GU PSH      PSH Notes: L-4 L-5 surgery 2015   NON-GU PSH: Back surgery - about 2016     GU PMH: Acute Cystitis/UTI - 06/13/2020 Gross hematuria - 06/13/2020, (Stable), - 06/05/2020, - 01/28/2020, - 12/13/2019, Gross hematuria, - 2014 History of urolithiasis - 06/13/2020, - 12/13/2019 Renal calculus - 06/05/2020, - 01/28/2020 Flank Pain, Generalized abdominal pain - 2014 Other microscopic hematuria, Microscopic hematuria - 2014 Ureteral calculus, Ureteral Stone - 2014, Proximal Ureteral Stone On The Left, - 2014      PMH Notes:  1898-09-13 00:00:00 - Note: Normal Routine History And Physical Adult  2012-06-09 10:41:18 - Note: Nephrolithiasis Of The Left Kidney   NON-GU PMH: Acquired absence of kidney, Right - 06/05/2020, Right, Solitary kidney, acquired, - 2014 Anxiety, Anxiety (Symptom) - 2014 Personal history of other diseases of the digestive system, History of esophageal reflux - 2014 Personal history of other mental and behavioral disorders, History of depression - 2014 Renal agenesis, unspecified, Congenital Absence Of Kidney - 2014 Arthritis GERD    FAMILY HISTORY: Alzheimer's Disease - Mother Family Health Status Number - Runs In Family nephrolithiasis - Father, Grandfather, Grandmother    SOCIAL HISTORY: Marital Status: Married Preferred Language: English; Race: White Current Smoking Status: Patient has never smoked.   Tobacco Use Assessment Completed: Used Tobacco in last 30 days? Drinks 1 drink per day. Types of alcohol consumed: Wine.  Drinks 2 caffeinated drinks per day. Patient's occupation Special educational needs teacher.     Notes: Never A Smoker, Alcohol Use, Caffeine Use, Tobacco Use, Marital History - Currently Married, Occupation: 1 son 1 daughter   REVIEW OF SYSTEMS:    GU Review Female:   Patient denies frequent urination, hard to postpone urination, burning /pain with urination, get up at night to urinate, leakage of urine, stream starts and stops, trouble starting your stream, have to strain to urinate, and being pregnant.  Gastrointestinal (Upper):   Patient denies nausea, vomiting, and indigestion/ heartburn.  Gastrointestinal (Lower):   Patient denies diarrhea and constipation.  Musculoskeletal:   Patient denies back pain and joint pain.  Neurological:   Patient denies headaches and dizziness.  Psychologic:   Patient denies depression and anxiety.   Notes: . Following up on a left sided    VITAL SIGNS:      12/10/2020 01:22 PM  Weight 145 lb / 65.77 kg  Height 68.5 in / 173.99 cm  BP 114/73 mmHg  Pulse 76 /min  Temperature 97.7 F / 36.5 C  BMI 21.7 kg/m   GU PHYSICAL EXAMINATION:      Notes: No CVA tenderness   MULTI-SYSTEM PHYSICAL EXAMINATION:    Constitutional: Thin. No physical deformities. Normally developed. Good grooming.   Respiratory: No labored breathing,  no use of accessory muscles.   Cardiovascular: Normal temperature, normal extremity pulses, no swelling, no varicosities.  Skin: No paleness, no jaundice, no cyanosis. No lesion, no ulcer, no rash.  Neurologic / Psychiatric: Oriented to time, oriented to place, oriented to person. No depression, no anxiety, no agitation.  Gastrointestinal: No mass, no tenderness, no rigidity, non  obese abdomen.     Complexity of Data:  Source Of History:  Patient, Medical Record Summary  Records Review:   Previous Doctor Records, Previous Patient Records  Urine Test Review:   Urinalysis  X-Ray Review: KUB: Reviewed Films. Discussed With Patient.  Renal Ultrasound (Limited): Reviewed Films. Reviewed Report. Discussed With Patient.  C.T. Stone Protocol: Reviewed Films. Reviewed Report. Discussed With Patient.     12/10/20  Urinalysis  Urine Appearance Slightly Cloudy   Urine Color Yellow   Urine Glucose Neg mg/dL  Urine Bilirubin Neg mg/dL  Urine Ketones Neg mg/dL  Urine Specific Gravity 1.020   Urine Blood Neg ery/uL  Urine pH 7.5   Urine Protein Neg mg/dL  Urine Urobilinogen 0.2 mg/dL  Urine Nitrites Neg   Urine Leukocyte Esterase Neg leu/uL  Urine WBC/hpf NS (Not Seen)   Urine RBC/hpf 0 - 2/hpf   Urine Epithelial Cells 0 - 5/hpf   Urine Bacteria Rare (0-9/hpf)   Urine Mucous Present   Urine Yeast NS (Not Seen)   Urine Trichomonas Not Present   Urine Cystals NS (Not Seen)   Urine Casts NS (Not Seen)   Urine Sperm Not Present    PROCEDURES:         Renal Ultrasound (Limited) - 14970  Kidney:left Length: 12.4 cm Depth: 6.3 cm Cortical Width: 2.3 cm Width: 7.3 cm    Left Kidney/Ureter:  .62 cm echogenic foci with shadow in LP   Bladder:  PVR 65.54ml .51cm echogenic foci ?stone at base of bladder       . Patient confirmed No Neulasta OnPro Device.            KUB - [id number]  A single view of the abdomen is obtained. There is a prominent overlying bowel gas pattern that is obstructing the view of the bilateral renal shadows as well as the lower pelvis. I do not appreciate any opacities within the expected course of either ureter however this is difficult to discern from this exam today to overlying bowel gas.       . Patient confirmed No Neulasta OnPro Device.           Urinalysis w/Scope - 81001 Dipstick Dipstick Cont'd Micro  Color: Yellow Bilirubin: Neg  WBC/hpf: NS (Not Seen)  Appearance: Slightly Cloudy Ketones: Neg RBC/hpf: 0 - 2/hpf  Specific Gravity: 1.020 Blood: Neg Bacteria: Rare (0-9/hpf)  pH: 7.5 Protein: Neg Cystals: NS (Not Seen)  Glucose: Neg Urobilinogen: 0.2 Casts: NS (Not Seen)    Nitrites: Neg Trichomonas: Not Present    Leukocyte Esterase: Neg Mucous: Present      Epithelial Cells: 0 - 5/hpf      Yeast: NS (Not Seen)      Sperm: Not Present    Notes:      ASSESSMENT:      ICD-10 Details  1 GU:   Renal calculus - N20.0 Left, Acute, Uncomplicated   PLAN:           Orders         Schedule X-Rays: Today 12/05/2020 - Renal Ultrasound (Limited) - LEft please    Today 12/05/2020 - KUB  Return Visit/Planned Activity: Next Available Appointment - Schedule Surgery          Document Letter(s):  Created for Patient: Clinical Summary         Notes:   Urinalysis not concerning for infectious process today. Review with images does show the previous UPJ stone has moved in to the left lower pole of the kidney today. I reviewed the images with her urologist. It was felt that ureteroscopy would be beneficial and safest procedure for definitive stone intervention. I discussed the risks of ureteroscopy in detail as well as the procedure itself. She verbalized her understanding. Surgical scheduling she was completed and handed into the scheduler. She was agreeable with the plan and will notify the clinic for any changes or concerns in the interval.        Next Appointment:      Next Appointment: 01/22/2021 10:45 AM    Appointment Type: Renal Ultrasound    Location: [facility] [phone]    Provider: Radiology Rm1 Radiology Rm 1    Reason for Visit: RENAL U/S POST OP

## 2020-12-19 ENCOUNTER — Encounter (HOSPITAL_COMMUNITY): Payer: Self-pay | Admitting: Urology

## 2020-12-19 ENCOUNTER — Ambulatory Visit (HOSPITAL_COMMUNITY): Payer: BLUE CROSS/BLUE SHIELD | Admitting: Registered Nurse

## 2020-12-19 ENCOUNTER — Ambulatory Visit (HOSPITAL_COMMUNITY)
Admission: RE | Admit: 2020-12-19 | Discharge: 2020-12-19 | Disposition: A | Discharge status: Home / Self Care | Payer: BLUE CROSS/BLUE SHIELD | Attending: Urology | Admitting: Urology

## 2020-12-19 ENCOUNTER — Ambulatory Visit (HOSPITAL_COMMUNITY): Payer: BLUE CROSS/BLUE SHIELD

## 2020-12-19 ENCOUNTER — Encounter (HOSPITAL_COMMUNITY)
Admission: RE | Disposition: A | Discharge status: Home / Self Care | Payer: Self-pay | Source: Home / Self Care | Attending: Urology

## 2020-12-19 DIAGNOSIS — N202 Calculus of kidney with calculus of ureter: Secondary | ICD-10-CM | POA: Insufficient documentation

## 2020-12-19 DIAGNOSIS — Q600 Renal agenesis, unilateral: Secondary | ICD-10-CM | POA: Insufficient documentation

## 2020-12-19 DIAGNOSIS — Z20822 Contact with and (suspected) exposure to covid-19: Secondary | ICD-10-CM | POA: Insufficient documentation

## 2020-12-19 HISTORY — PX: CYSTOSCOPY WITH URETEROSCOPY: SHX5123

## 2020-12-19 SURGERY — CYSTOSCOPY WITH URETEROSCOPY
Anesthesia: General | Laterality: Left

## 2020-12-19 MED ORDER — CHLORHEXIDINE GLUCONATE 0.12 % MT SOLN
15.0000 mL | Freq: Once | OROMUCOSAL | Status: AC
  Administered 2020-12-19: 15 mL via OROMUCOSAL

## 2020-12-19 MED ORDER — CEFAZOLIN SODIUM-DEXTROSE 2-4 GM/100ML-% IV SOLN
2.0000 g | INTRAVENOUS | Status: AC
  Administered 2020-12-19: 2 g via INTRAVENOUS
  Filled 2020-12-19: qty 100

## 2020-12-19 MED ORDER — PHENYLEPHRINE HCL (PRESSORS) 10 MG/ML IV SOLN
INTRAVENOUS | Status: DC | PRN
  Administered 2020-12-19: 130 ug via INTRAVENOUS

## 2020-12-19 MED ORDER — PHENYLEPHRINE 40 MCG/ML (10ML) SYRINGE FOR IV PUSH (FOR BLOOD PRESSURE SUPPORT)
PREFILLED_SYRINGE | INTRAVENOUS | Status: AC
  Filled 2020-12-19: qty 10

## 2020-12-19 MED ORDER — FENTANYL CITRATE (PF) 250 MCG/5ML IJ SOLN
INTRAMUSCULAR | Status: AC
  Filled 2020-12-19: qty 5

## 2020-12-19 MED ORDER — OXYCODONE HCL 5 MG/5ML PO SOLN: 5.0000 mg | Freq: Once | ORAL | Status: AC | PRN

## 2020-12-19 MED ORDER — LIDOCAINE 2% (20 MG/ML) 5 ML SYRINGE
INTRAMUSCULAR | Status: AC
  Filled 2020-12-19: qty 5

## 2020-12-19 MED ORDER — MIDAZOLAM HCL 5 MG/5ML IJ SOLN
INTRAMUSCULAR | Status: DC | PRN
  Administered 2020-12-19: 2 mg via INTRAVENOUS

## 2020-12-19 MED ORDER — MIDAZOLAM HCL 2 MG/2ML IJ SOLN
INTRAMUSCULAR | Status: AC
  Filled 2020-12-19: qty 2

## 2020-12-19 MED ORDER — PROPOFOL 10 MG/ML IV BOLUS
INTRAVENOUS | Status: AC
  Filled 2020-12-19: qty 20

## 2020-12-19 MED ORDER — KETOROLAC TROMETHAMINE 30 MG/ML IJ SOLN
30.0000 mg | Freq: Once | INTRAMUSCULAR | Status: AC
  Administered 2020-12-19: 30 mg via INTRAVENOUS

## 2020-12-19 MED ORDER — OXYCODONE HCL 5 MG PO TABS: 5.0000 mg | ORAL_TABLET | Freq: Four times a day (QID) | ORAL | 0 refills | Status: DC | PRN

## 2020-12-19 MED ORDER — LACTATED RINGERS IV SOLN: INTRAVENOUS | Status: DC

## 2020-12-19 MED ORDER — FENTANYL CITRATE (PF) 100 MCG/2ML IJ SOLN
25.0000 ug | INTRAMUSCULAR | Status: DC | PRN
  Administered 2020-12-19 (×2): 25 ug via INTRAVENOUS

## 2020-12-19 MED ORDER — OXYCODONE HCL 5 MG PO TABS
5.0000 mg | ORAL_TABLET | Freq: Once | ORAL | Status: AC | PRN
  Administered 2020-12-19: 5 mg via ORAL

## 2020-12-19 MED ORDER — DEXAMETHASONE SODIUM PHOSPHATE 10 MG/ML IJ SOLN
INTRAMUSCULAR | Status: AC
  Filled 2020-12-19: qty 1

## 2020-12-19 MED ORDER — OXYCODONE HCL 5 MG PO TABS
ORAL_TABLET | ORAL | Status: AC
  Filled 2020-12-19: qty 1

## 2020-12-19 MED ORDER — SODIUM CHLORIDE 0.9% FLUSH: 3.0000 mL | Freq: Two times a day (BID) | INTRAVENOUS | Status: DC

## 2020-12-19 MED ORDER — FENTANYL CITRATE (PF) 100 MCG/2ML IJ SOLN
INTRAMUSCULAR | Status: DC | PRN
  Administered 2020-12-19 (×2): 50 ug via INTRAVENOUS

## 2020-12-19 MED ORDER — ONDANSETRON HCL 4 MG/2ML IJ SOLN
INTRAMUSCULAR | Status: AC
  Filled 2020-12-19: qty 2

## 2020-12-19 MED ORDER — AMISULPRIDE (ANTIEMETIC) 5 MG/2ML IV SOLN: 10.0000 mg | Freq: Once | INTRAVENOUS | Status: DC | PRN

## 2020-12-19 MED ORDER — KETOROLAC TROMETHAMINE 30 MG/ML IJ SOLN
INTRAMUSCULAR | Status: AC
  Filled 2020-12-19: qty 1

## 2020-12-19 MED ORDER — ONDANSETRON HCL 4 MG/2ML IJ SOLN
INTRAMUSCULAR | Status: DC | PRN
  Administered 2020-12-19: 4 mg via INTRAVENOUS

## 2020-12-19 MED ORDER — ACETAMINOPHEN 10 MG/ML IV SOLN
INTRAVENOUS | Status: AC
  Filled 2020-12-19: qty 100

## 2020-12-19 MED ORDER — ONDANSETRON HCL 4 MG/2ML IJ SOLN: 4.0000 mg | Freq: Once | INTRAMUSCULAR | Status: DC | PRN

## 2020-12-19 MED ORDER — LIDOCAINE HCL (CARDIAC) PF 50 MG/5ML IV SOSY
PREFILLED_SYRINGE | INTRAVENOUS | Status: DC | PRN
  Administered 2020-12-19: 75 mg via INTRAVENOUS

## 2020-12-19 MED ORDER — 0.9 % SODIUM CHLORIDE (POUR BTL) OPTIME
TOPICAL | Status: DC | PRN
  Administered 2020-12-19: 1000 mL

## 2020-12-19 MED ORDER — ACETAMINOPHEN 10 MG/ML IV SOLN
1000.0000 mg | Freq: Once | INTRAVENOUS | Status: AC
  Administered 2020-12-19: 1000 mg via INTRAVENOUS

## 2020-12-19 MED ORDER — DEXAMETHASONE SODIUM PHOSPHATE 10 MG/ML IJ SOLN
INTRAMUSCULAR | Status: DC | PRN
  Administered 2020-12-19: 10 mg via INTRAVENOUS

## 2020-12-19 MED ORDER — PROPOFOL 10 MG/ML IV BOLUS
INTRAVENOUS | Status: DC | PRN
  Administered 2020-12-19: 30 mg via INTRAVENOUS
  Administered 2020-12-19: 150 mg via INTRAVENOUS

## 2020-12-19 MED ORDER — SODIUM CHLORIDE 0.9 % IR SOLN
Status: DC | PRN
  Administered 2020-12-19: 3000 mL

## 2020-12-19 MED ORDER — PHENAZOPYRIDINE HCL 200 MG PO TABS: 200.0000 mg | ORAL_TABLET | Freq: Three times a day (TID) | ORAL | 0 refills | Status: DC | PRN

## 2020-12-19 MED ORDER — FENTANYL CITRATE (PF) 100 MCG/2ML IJ SOLN
INTRAMUSCULAR | Status: AC
  Filled 2020-12-19: qty 2

## 2020-12-19 MED ORDER — IOHEXOL 300 MG/ML  SOLN
INTRAMUSCULAR | Status: DC | PRN
  Administered 2020-12-19: 8 mL

## 2020-12-19 MED ORDER — ORAL CARE MOUTH RINSE: 15.0000 mL | Freq: Once | OROMUCOSAL | Status: AC

## 2020-12-19 SURGICAL SUPPLY — 23 items
BAG URO CATCHER STRL LF (MISCELLANEOUS) ×2 IMPLANT
BASKET STONE NCOMPASS (UROLOGICAL SUPPLIES) IMPLANT
CATH URET 5FR 28IN OPEN ENDED (CATHETERS) ×2 IMPLANT
CATH URET DUAL LUMEN 6-10FR 50 (CATHETERS) IMPLANT
CLOTH BEACON ORANGE TIMEOUT ST (SAFETY) ×2 IMPLANT
EXTRACTOR STONE NITINOL NGAGE (UROLOGICAL SUPPLIES) ×1 IMPLANT
FIBER LASER TRACTIP 200 (UROLOGICAL SUPPLIES) ×1 IMPLANT
GLOVE SURG POLYISO LF SZ8 (GLOVE) ×2 IMPLANT
GOWN STRL REUS W/TWL XL LVL3 (GOWN DISPOSABLE) ×2 IMPLANT
GUIDEWIRE STR DUAL SENSOR (WIRE) ×3 IMPLANT
IV NS IRRIG 3000ML ARTHROMATIC (IV SOLUTION) ×2 IMPLANT
KIT TURNOVER KIT A (KITS) ×2 IMPLANT
LASER FIB FLEXIVA PULSE ID 365 (Laser) IMPLANT
LASER FIB FLEXIVA PULSE ID 550 (Laser) IMPLANT
LASER FIB FLEXIVA PULSE ID 910 (Laser) IMPLANT
MANIFOLD NEPTUNE II (INSTRUMENTS) ×2 IMPLANT
PACK CYSTO (CUSTOM PROCEDURE TRAY) ×2 IMPLANT
SHEATH URETERAL 12FRX35CM (MISCELLANEOUS) ×1 IMPLANT
STENT URET 6FRX24 CONTOUR (STENTS) ×1 IMPLANT
TRACTIP FLEXIVA PULS ID 200XHI (Laser) IMPLANT
TRACTIP FLEXIVA PULSE ID 200 (Laser)
TUBING CONNECTING 10 (TUBING) ×2 IMPLANT
TUBING UROLOGY SET (TUBING) ×2 IMPLANT

## 2020-12-19 NOTE — Anesthesia Postprocedure Evaluation (Signed)
Anesthesia Post Note  Patient: Kristina Sims  Procedure(s) Performed: CYSTOSCOPY WITH URETEROSCOPY (Left )     Patient location during evaluation: PACU Anesthesia Type: General Level of consciousness: awake and alert Pain management: pain level controlled Vital Signs Assessment: post-procedure vital signs reviewed and stable Respiratory status: spontaneous breathing, nonlabored ventilation and respiratory function stable Cardiovascular status: blood pressure returned to baseline and stable Postop Assessment: no apparent nausea or vomiting Anesthetic complications: no   No complications documented.  Last Vitals:  Vitals:   12/19/20 1354 12/19/20 1420  BP: 112/78 132/85  Pulse: 70 70  Resp: 16 17  Temp: 36.9 C   SpO2: 99% 97%    Last Pain:  Vitals:   12/19/20 1420  TempSrc:   PainSc: 7                  Brando Taves E Ruffin Lada

## 2020-12-19 NOTE — Interval H&P Note (Signed)
History and Physical Interval Note:  12/19/2020 11:48 AM  Kristina Sims  has presented today for surgery, with the diagnosis of LEFT RENAL STONE.  The various methods of treatment have been discussed with the patient and family. After consideration of risks, benefits and other options for treatment, the patient has consented to  Procedure(s): CYSTOSCOPY WITH URETEROSCOPY (Left) as a surgical intervention.  The patient's history has been reviewed, patient examined, no change in status, stable for surgery.  I have reviewed the patient's chart and labs.  Questions were answered to the patient's satisfaction.     Bjorn Pippin

## 2020-12-19 NOTE — Op Note (Signed)
Procedure: 1.  Cystoscopy with left retrograde pyelogram and interpretation. 2.  Left ureteroscopic stone extraction with holmium laser application and placement of left double-J stent with tether.  Preop diagnosis: Left renal stone.  Postop diagnosis: Same.  Surgeon: Dr. Bjorn Pippin.  Anesthesia: General.  Specimen: Stone fragments.  Drains: 6 French by 24 cm left contour double-J stent with tether.  EBL: None.  Complications: None.  Indications: The patient is a 50 year old female with a solitary left kidney who originally presented with a 5 mm left UPJ stone.  Subsequent imaging showed the stone had returned to the left lower pole but it was felt the treatment is then indicated because of the solitary kidney status and the fact that the stone had showed mobility.  Procedure: She was given antibiotic.  A general anesthetic was induced.  She was placed in lithotomy position and fitted with PAS hose.  Her perineum and genitalia were prepped with Betadine solution and she was draped in usual sterile fashion.  Cystoscopy was performed using a 21 [place] scope and 30 degree lens.  Examination revealed a normal urethra.  The bladder wall was smooth and pale without tumors, stones or inflammation.  Ureteral orifices were unremarkable.  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  A 5 French open-ended catheter was passed into the left ureteral orifice and contrast was instilled.  Left retrograde pyelogram revealed a normal caliber ureter and a nondilated intrarenal collecting system with a filling defect in the lower pole consistent with her known stone.  A sensor wire was advanced to the kidney through the open-ended catheter and the cystoscope and open-ended catheter were then removed.  A 35 cm digital access sheath 12 French inner core was advanced over the wire to the kidney without difficulty.  The assembled sheath was then passed also without difficulty.   The inner core and wire were then removed.  The dual-lumen digital flexible scope was then inserted and advanced into the collecting system without difficulty.  The stone was identified in the lower pole and was grasped with an engage basket but was found to be too large to remove intact.  The stone was then repositioned to a midpole calyx and fragmented with the Moses holmium laser with the settings of point 2 J and 70 Hz on the left pedal and 1 J and 15 Hz on the right pedal.  Once the stone had been adequately fragmented, the fragments were removed with the engage basket.  Final inspection revealed only sand in the collecting system and no substantial fragments.  The entire collecting system was inspected.  A sensor wire was then advanced through the scope into the kidney and the scope was removed while visually inspecting the ureter.  No ureteral injury was identified.  The cystoscope was then reinserted over the wire and a 6 [place] by 24 cm contour double-J stent with tether was advanced to the kidney under fluoroscopic guidance.  The wire was removed, leaving a good coil in the kidney and a good coil in the bladder.  The cystoscope was removed after the bladder was drained and the stent string was left exiting the urethra.  String was tied close to the meatus and trimmed to an appropriate length then tucked vaginally.  She was taken down from lithotomy position, her anesthetic was reversed and she was moved recovery in stable condition.  There were no complications.

## 2020-12-19 NOTE — Anesthesia Preprocedure Evaluation (Signed)
Anesthesia Evaluation  Patient identified by MRN, date of birth, ID band Patient awake    Reviewed: Allergy & Precautions, NPO status , Patient's Chart, lab work & pertinent test results  History of Anesthesia Complications Negative for: history of anesthetic complications  Airway Mallampati: I  TM Distance: >3 FB Neck ROM: Full    Dental  (+) Teeth Intact   Pulmonary neg pulmonary ROS,    Pulmonary exam normal        Cardiovascular negative cardio ROS Normal cardiovascular exam     Neuro/Psych Anxiety Depression negative neurological ROS     GI/Hepatic negative GI ROS, Neg liver ROS,   Endo/Other  negative endocrine ROS  Renal/GU Renal diseaseRenal agenesis L renal stone  negative genitourinary   Musculoskeletal negative musculoskeletal ROS (+)   Abdominal   Peds  Hematology negative hematology ROS (+)   Anesthesia Other Findings   Reproductive/Obstetrics                             Anesthesia Physical Anesthesia Plan  ASA: II  Anesthesia Plan: General   Post-op Pain Management:    Induction: Intravenous  PONV Risk Score and Plan: 3 and Ondansetron, Dexamethasone, Midazolam and Treatment may vary due to age or medical condition  Airway Management Planned: LMA  Additional Equipment: None  Intra-op Plan:   Post-operative Plan: Extubation in OR  Informed Consent: I have reviewed the patients History and Physical, chart, labs and discussed the procedure including the risks, benefits and alternatives for the proposed anesthesia with the patient or authorized representative who has indicated his/her understanding and acceptance.     Dental advisory given  Plan Discussed with:   Anesthesia Plan Comments:         Anesthesia Quick Evaluation

## 2020-12-19 NOTE — OR Nursing (Signed)
Dr. Annabell Howells took patient stones.

## 2020-12-19 NOTE — Anesthesia Procedure Notes (Signed)
Procedure Name: LMA Insertion Date/Time: 12/19/2020 12:24 PM Performed by: Illene Silver, CRNA Pre-anesthesia Checklist: Patient identified, Emergency Drugs available, Suction available and Patient being monitored Patient Re-evaluated:Patient Re-evaluated prior to induction Oxygen Delivery Method: Circle system utilized Preoxygenation: Pre-oxygenation with 100% oxygen Induction Type: IV induction Ventilation: Mask ventilation without difficulty LMA: LMA with gastric port inserted LMA Size: 4.0 Tube type: Oral Number of attempts: 1 Airway Equipment and Method: Oral airway Placement Confirmation: positive ETCO2 Tube secured with: Tape Dental Injury: Teeth and Oropharynx as per pre-operative assessment

## 2020-12-19 NOTE — Transfer of Care (Signed)
Immediate Anesthesia Transfer of Care Note  Patient: Kristina Sims  Procedure(s) Performed: CYSTOSCOPY WITH URETEROSCOPY (Left )  Patient Location: PACU  Anesthesia Type:General  Level of Consciousness: awake, alert , oriented and patient cooperative  Airway & Oxygen Therapy: Patient Spontanous Breathing and Patient connected to face mask oxygen  Post-op Assessment: Report given to RN, Post -op Vital signs reviewed and stable and Patient moving all extremities X 4  Post vital signs: stable  Last Vitals:  Vitals Value Taken Time  BP 114/73 12/19/20 1317  Temp 36.9 C 12/19/20 1317  Pulse 67 12/19/20 1324  Resp 11 12/19/20 1323  SpO2 100 % 12/19/20 1324  Vitals shown include unvalidated device data.  Last Pain:  Vitals:   12/19/20 0937  TempSrc: Oral  PainSc: 0-No pain      Patients Stated Pain Goal: 3 (12/19/20 7106)  Complications: No complications documented.

## 2020-12-19 NOTE — Discharge Instructions (Addendum)
Ureteral Stent Implantation, Care After This sheet gives you information about how to care for yourself after your procedure. Your health care provider may also give you more specific instructions. If you have problems or questions, contact your health care provider. What can I expect after the procedure? After the procedure, it is common to have:  Nausea.  Mild pain when you urinate. You may feel this pain in your lower back or lower abdomen. The pain should stop within a few minutes after you urinate. This may last for up to 1 week.  A small amount of blood in your urine for several days. Follow these instructions at home: Medicines  Take over-the-counter and prescription medicines only as told by your health care provider.  If you were prescribed an antibiotic medicine, take it as told by your health care provider. Do not stop taking the antibiotic even if you start to feel better.  Do not drive for 24 hours if you were given a sedative during your procedure.  Ask your health care provider if the medicine prescribed to you requires you to avoid driving or using heavy machinery. Activity  Rest as told by your health care provider.  Avoid sitting for a long time without moving. Get up to take short walks every 1-2 hours. This is important to improve blood flow and breathing. Ask for help if you feel weak or unsteady.  Return to your normal activities as told by your health care provider. Ask your health care provider what activities are safe for you. General instructions  Watch for any blood in your urine. Call your health care provider if the amount of blood in your urine increases.  If you have a catheter: ? Follow instructions from your health care provider about taking care of your catheter and collection bag. ? Do not take baths, swim, or use a hot tub until your health care provider approves. Ask your health care provider if you may take showers. You may only be allowed to  take sponge baths.  Drink enough fluid to keep your urine pale yellow.  Do not use any products that contain nicotine or tobacco, such as cigarettes, e-cigarettes, and chewing tobacco. These can delay healing after surgery. If you need help quitting, ask your health care provider.  Keep all follow-up visits as told by your health care provider. This is important.   Contact a health care provider if:  You have pain that gets worse or does not get better with medicine, especially pain when you urinate.  You have difficulty urinating.  You feel nauseous or you vomit repeatedly during a period of more than 2 days after the procedure. Get help right away if:  Your urine is dark red or has blood clots in it.  You are leaking urine (have incontinence).  The end of the stent comes out of your urethra.  You cannot urinate.  You have sudden, sharp, or severe pain in your abdomen or lower back.  You have a fever.  You have swelling or pain in your legs.  You have difficulty breathing. Summary  After the procedure, it is common to have mild pain when you urinate that goes away within a few minutes after you urinate. This may last for up to 1 week.  Watch for any blood in your urine. Call your health care provider if the amount of blood in your urine increases.  Take over-the-counter and prescription medicines only as told by your health care provider.  Drink enough fluid to keep your urine pale yellow.  You may remove the stent on Monday morning by pulling the attached string.  If you don't feel you can do this, please call the office to set up a time to have it removed.  Please either drop off your stone fragments at the office or bring them for your appointment so we can have them analyzed.  This information is not intended to replace advice given to you by your health care provider. Make sure you discuss any questions you have with your health care provider. Document Revised:  06/06/2018 Document Reviewed: 06/07/2018 Elsevier Patient Education  2021 [employer].

## 2020-12-20 ENCOUNTER — Encounter (HOSPITAL_COMMUNITY): Payer: Self-pay | Admitting: Urology

## 2020-12-22 ENCOUNTER — Encounter (HOSPITAL_BASED_OUTPATIENT_CLINIC_OR_DEPARTMENT_OTHER): Payer: Self-pay

## 2020-12-22 ENCOUNTER — Emergency Department (HOSPITAL_BASED_OUTPATIENT_CLINIC_OR_DEPARTMENT_OTHER)
Admission: EM | Admit: 2020-12-22 | Discharge: 2020-12-22 | Disposition: A | Discharge status: Home / Self Care | Payer: BLUE CROSS/BLUE SHIELD | Attending: Emergency Medicine | Admitting: Emergency Medicine

## 2020-12-22 ENCOUNTER — Other Ambulatory Visit: Payer: Self-pay

## 2020-12-22 DIAGNOSIS — N189 Chronic kidney disease, unspecified: Secondary | ICD-10-CM | POA: Insufficient documentation

## 2020-12-22 DIAGNOSIS — E039 Hypothyroidism, unspecified: Secondary | ICD-10-CM | POA: Insufficient documentation

## 2020-12-22 DIAGNOSIS — N3289 Other specified disorders of bladder: Secondary | ICD-10-CM | POA: Diagnosis not present

## 2020-12-22 DIAGNOSIS — R39198 Other difficulties with micturition: Secondary | ICD-10-CM | POA: Diagnosis present

## 2020-12-22 DIAGNOSIS — R339 Retention of urine, unspecified: Secondary | ICD-10-CM

## 2020-12-22 LAB — URINALYSIS, ROUTINE W REFLEX MICROSCOPIC
Glucose, UA: NEGATIVE mg/dL
Ketones, ur: NEGATIVE mg/dL
Leukocytes,Ua: NEGATIVE
Nitrite: POSITIVE — AB
Protein, ur: 100 mg/dL — AB
RBC / HPF: >50 RBC/hpf — ABNORMAL HIGH (ref 0–5)
Specific Gravity, Urine: 1.02 (ref 1.005–1.030)
pH: 6 (ref 5.0–8.0)

## 2020-12-22 NOTE — ED Provider Notes (Signed)
[facility] EMERGENCY DEPT Provider Note   CSN: [id number] Arrival date & time: 12/22/20  1754     History Chief Complaint  Patient presents with  . Dysuria    Kristina Sims is a 50 y.o. female.  Pt presents to the ED today unable to urinate.  The pt has a solitary left kidney.  She had a stone in her left ureter.  Dr. Annabell Howells placed a stent on 4/8 which she removed this am around 0830.  She was able to urinate after that, but she stopped being able to urinate about 2 hrs ago.  She said she feels like she needs to urinate, but can't.  She has had a lot of spasms since the stent was removed.        Past Medical History:  Diagnosis Date  . Anxiety   . Chronic kidney disease    Renal Agenesis  . Depression   . Dysrhythmia 2017   stress ,coffee and dehydration  . History of kidney stones   . Palpitations 11/27/2015  . RLD (ruptured lumbar disc)   . Solitary kidney     Patient Active Problem List   Diagnosis Date Noted  . Palpitations 11/27/2015  . Hypothyroidism 11/27/2015    Past Surgical History:  Procedure Laterality Date  . BACK SURGERY  2015   L5-S1  . CYSTOSCOPY WITH URETEROSCOPY Left 12/19/2020   Procedure: CYSTOSCOPY WITH URETEROSCOPY;  Surgeon: Bjorn Pippin, MD;  Location: [facility];  Service: Urology;  Laterality: Left;     OB History   No obstetric history on file.     Family History  Problem Relation Age of Onset  . Diverticulitis Mother        Had resection  . Dementia Mother   . Bradycardia Mother   . Colon polyps Paternal Grandfather   . Atrial fibrillation Father   . Thyroid disease Father   . Peripheral Artery Disease Maternal Grandmother   . Alzheimer's disease Maternal Grandfather     Social History   Tobacco Use  . Smoking status: Never Smoker  . Smokeless tobacco: Never Used  Vaping Use  . Vaping Use: Never used  Substance Use Topics  . Alcohol use: Yes    Comment: glass daily  . Drug use: No    Home  Medications Prior to Admission medications   Medication Sig Start Date End Date Taking? Authorizing Provider  ondansetron (ZOFRAN ODT) 4 MG disintegrating tablet Take 1 tablet (4 mg total) by mouth every 8 (eight) hours as needed for up to 10 doses for nausea or vomiting. 12/04/20  Yes Sabino Donovan, MD  oxyCODONE (ROXICODONE) 5 MG immediate release tablet Take 1 tablet (5 mg total) by mouth every 6 (six) hours as needed for moderate pain or severe pain. 12/19/20 12/19/21 Yes Bjorn Pippin, MD  phenazopyridine (PYRIDIUM) 200 MG tablet Take 1 tablet (200 mg total) by mouth 3 (three) times daily as needed for pain. 12/19/20  Yes Bjorn Pippin, MD  estradiol (VIVELLE-DOT) 0.05 MG/24HR patch Place 1 patch onto the skin 2 (two) times a week. Fridays & Mondays 11/30/20   [provider]  ibuprofen (ADVIL) 200 MG tablet Take 400 mg by mouth every 8 (eight) hours as needed (pain).    [provider]  progesterone (PROMETRIUM) 100 MG capsule Take 200 mg by mouth at bedtime. 10/22/20   [provider]  sertraline (ZOLOFT) 100 MG tablet Take 100 mg by mouth in the morning.    [provider]  Allergies    Patient has no known allergies.  Review of Systems   Review of Systems  Genitourinary: Positive for difficulty urinating.  All other systems reviewed and are negative.   Physical Exam Updated Vital Signs BP 106/76 (BP Location: Right Arm)   Pulse 75   Temp 98.7 F (37.1 C) (Oral)   Resp 18   Ht 5\' 8"  (1.727 m)   Wt 65.8 kg   SpO2 98%   BMI 22.05 kg/m   Physical Exam Vitals and nursing note reviewed.  Constitutional:      Appearance: Normal appearance.  HENT:     Head: Normocephalic and atraumatic.     Right Ear: External ear normal.     Left Ear: External ear normal.     Nose: Nose normal.     Mouth/Throat:     Mouth: Mucous membranes are moist.     Pharynx: Oropharynx is clear.  Eyes:     Extraocular Movements: Extraocular movements intact.      Conjunctiva/sclera: Conjunctivae normal.     Pupils: Pupils are equal, round, and reactive to light.  Cardiovascular:     Rate and Rhythm: Normal rate and regular rhythm.     Pulses: Normal pulses.     Heart sounds: Normal heart sounds.  Pulmonary:     Effort: Pulmonary effort is normal.     Breath sounds: Normal breath sounds.  Abdominal:     General: Abdomen is flat. Bowel sounds are normal.     Palpations: Abdomen is soft.  Musculoskeletal:        General: Normal range of motion.     Cervical back: Normal range of motion and neck supple.  Skin:    General: Skin is warm.     Capillary Refill: Capillary refill takes less than 2 seconds.  Neurological:     General: No focal deficit present.     Mental Status: She is alert and oriented to person, place, and time.  Psychiatric:        Mood and Affect: Mood normal.        Behavior: Behavior normal.        Thought Content: Thought content normal.        Judgment: Judgment normal.     ED Results / Procedures / Treatments   Labs (all labs ordered are listed, but only abnormal results are displayed) Labs Reviewed  URINALYSIS, ROUTINE W REFLEX MICROSCOPIC - Abnormal; Notable for the following components:      Result Value   APPearance HAZY (*)    Hgb urine dipstick LARGE (*)    Bilirubin Urine SMALL (*)    Protein, ur 100 (*)    Nitrite POSITIVE (*)    RBC / HPF >50 (*)    All other components within normal limits  URINE CULTURE    EKG None  Radiology No results found.  Procedures Procedures   Medications Ordered in ED Medications - No data to display  ED Course  I have reviewed the triage vital signs and the nursing notes.  Pertinent labs & imaging results that were available during my care of the patient were reviewed by me and considered in my medical decision making (see chart for details).    MDM Rules/Calculators/A&P                          Pt was able to urinate on her own and she feels much better.   She likely  had a bladder spasm.  Urine sent for culture.  She knows to return if worse.  F/u with urology. Final Clinical Impression(s) / ED Diagnoses Final diagnoses:  Bladder spasm  Urinary retention    Rx / DC Orders ED Discharge Orders    None       Jacalyn Lefevre, MD 12/22/20 2026

## 2020-12-22 NOTE — ED Triage Notes (Signed)
Pt reports that she had a kideny stone removed Friday and pulled her tube today and unable to urinate for the past 1 1/2 hours. Pt called her doctor and instructed to come in for an evaluation. Pt reports urgency but unable to urinate.

## 2020-12-23 ENCOUNTER — Ambulatory Visit (HOSPITAL_COMMUNITY): Payer: BLUE CROSS/BLUE SHIELD | Admitting: Certified Registered Nurse Anesthetist

## 2020-12-23 ENCOUNTER — Other Ambulatory Visit: Payer: Self-pay

## 2020-12-23 ENCOUNTER — Other Ambulatory Visit: Payer: Self-pay | Admitting: Urology

## 2020-12-23 ENCOUNTER — Emergency Department (HOSPITAL_BASED_OUTPATIENT_CLINIC_OR_DEPARTMENT_OTHER)
Admission: EM | Admit: 2020-12-23 | Discharge: 2020-12-23 | Disposition: A | Discharge status: Home / Self Care | Payer: BLUE CROSS/BLUE SHIELD | Source: Home / Self Care | Attending: Emergency Medicine | Admitting: Emergency Medicine

## 2020-12-23 ENCOUNTER — Ambulatory Visit (HOSPITAL_COMMUNITY): Payer: BLUE CROSS/BLUE SHIELD

## 2020-12-23 ENCOUNTER — Encounter (HOSPITAL_COMMUNITY): Payer: Self-pay | Admitting: Urology

## 2020-12-23 ENCOUNTER — Encounter (HOSPITAL_COMMUNITY)
Admission: AD | Disposition: A | Discharge status: Home / Self Care | Payer: Self-pay | Source: Ambulatory Visit | Attending: Urology

## 2020-12-23 ENCOUNTER — Ambulatory Visit (HOSPITAL_COMMUNITY)
Admission: AD | Admit: 2020-12-23 | Discharge: 2020-12-23 | Disposition: A | Discharge status: Home / Self Care | Payer: BLUE CROSS/BLUE SHIELD | Source: Ambulatory Visit | Attending: Urology | Admitting: Urology

## 2020-12-23 ENCOUNTER — Emergency Department (HOSPITAL_BASED_OUTPATIENT_CLINIC_OR_DEPARTMENT_OTHER): Payer: BLUE CROSS/BLUE SHIELD

## 2020-12-23 DIAGNOSIS — N132 Hydronephrosis with renal and ureteral calculous obstruction: Secondary | ICD-10-CM | POA: Insufficient documentation

## 2020-12-23 DIAGNOSIS — Z820 Family history of epilepsy and other diseases of the nervous system: Secondary | ICD-10-CM | POA: Diagnosis not present

## 2020-12-23 DIAGNOSIS — Z87442 Personal history of urinary calculi: Secondary | ICD-10-CM | POA: Insufficient documentation

## 2020-12-23 DIAGNOSIS — N189 Chronic kidney disease, unspecified: Secondary | ICD-10-CM | POA: Insufficient documentation

## 2020-12-23 DIAGNOSIS — N1330 Unspecified hydronephrosis: Secondary | ICD-10-CM

## 2020-12-23 DIAGNOSIS — E039 Hypothyroidism, unspecified: Secondary | ICD-10-CM | POA: Insufficient documentation

## 2020-12-23 DIAGNOSIS — R109 Unspecified abdominal pain: Secondary | ICD-10-CM

## 2020-12-23 DIAGNOSIS — Z841 Family history of disorders of kidney and ureter: Secondary | ICD-10-CM | POA: Diagnosis not present

## 2020-12-23 DIAGNOSIS — Z905 Acquired absence of kidney: Secondary | ICD-10-CM | POA: Insufficient documentation

## 2020-12-23 DIAGNOSIS — N131 Hydronephrosis with ureteral stricture, not elsewhere classified: Secondary | ICD-10-CM | POA: Insufficient documentation

## 2020-12-23 HISTORY — PX: CYSTOSCOPY WITH STENT PLACEMENT: SHX5790

## 2020-12-23 LAB — BASIC METABOLIC PANEL
Anion gap: 7 (ref 5–15)
BUN: 13 mg/dL (ref 6–20)
CO2: 30 mmol/L (ref 22–32)
Calcium: 9.4 mg/dL (ref 8.9–10.3)
Chloride: 101 mmol/L (ref 98–111)
Creatinine, Ser: 1.3 mg/dL — ABNORMAL HIGH (ref 0.44–1.00)
GFR, Estimated: 50 mL/min — ABNORMAL LOW (ref >60)
Glucose, Bld: 123 mg/dL — ABNORMAL HIGH (ref 70–99)
Potassium: 3.5 mmol/L (ref 3.5–5.1)
Sodium: 138 mmol/L (ref 135–145)

## 2020-12-23 LAB — CBC WITH DIFFERENTIAL/PLATELET
Abs Immature Granulocytes: 0.01 10*3/uL (ref 0.00–0.07)
Basophils Absolute: 0 10*3/uL (ref 0.0–0.1)
Basophils Relative: 0 %
Eosinophils Absolute: 0.1 10*3/uL (ref 0.0–0.5)
Eosinophils Relative: 1 %
HCT: 41.5 % (ref 36.0–46.0)
Hemoglobin: 13.9 g/dL (ref 12.0–15.0)
Immature Granulocytes: 0 %
Lymphocytes Relative: 22 %
Lymphs Abs: 1.5 10*3/uL (ref 0.7–4.0)
MCH: 32 pg (ref 26.0–34.0)
MCHC: 33.5 g/dL (ref 30.0–36.0)
MCV: 95.6 fL (ref 80.0–100.0)
Monocytes Absolute: 0.6 10*3/uL (ref 0.1–1.0)
Monocytes Relative: 8 %
Neutro Abs: 4.6 10*3/uL (ref 1.7–7.7)
Neutrophils Relative %: 69 %
Platelets: 209 10*3/uL (ref 150–400)
RBC: 4.34 MIL/uL (ref 3.87–5.11)
RDW: 11.6 % (ref 11.5–15.5)
WBC: 6.8 10*3/uL (ref 4.0–10.5)
nRBC: 0 % (ref 0.0–0.2)

## 2020-12-23 LAB — PREGNANCY, URINE: Preg Test, Ur: NEGATIVE

## 2020-12-23 SURGERY — CYSTOSCOPY, WITH STENT INSERTION
Anesthesia: General | Laterality: Left

## 2020-12-23 MED ORDER — DEXAMETHASONE SODIUM PHOSPHATE 10 MG/ML IJ SOLN
INTRAMUSCULAR | Status: DC | PRN
  Administered 2020-12-23: 4 mg via INTRAVENOUS

## 2020-12-23 MED ORDER — PHENYLEPHRINE 40 MCG/ML (10ML) SYRINGE FOR IV PUSH (FOR BLOOD PRESSURE SUPPORT)
PREFILLED_SYRINGE | INTRAVENOUS | Status: DC | PRN
  Administered 2020-12-23 (×2): 120 ug via INTRAVENOUS

## 2020-12-23 MED ORDER — FENTANYL CITRATE (PF) 100 MCG/2ML IJ SOLN
INTRAMUSCULAR | Status: DC | PRN
  Administered 2020-12-23: 25 ug via INTRAVENOUS

## 2020-12-23 MED ORDER — TRAMADOL HCL 50 MG PO TABS: 50.0000 mg | ORAL_TABLET | Freq: Four times a day (QID) | ORAL | 0 refills | Status: DC | PRN

## 2020-12-23 MED ORDER — ONDANSETRON HCL 4 MG/2ML IJ SOLN
INTRAMUSCULAR | Status: DC | PRN
  Administered 2020-12-23: 4 mg via INTRAVENOUS

## 2020-12-23 MED ORDER — PROPOFOL 10 MG/ML IV BOLUS
INTRAVENOUS | Status: DC | PRN
  Administered 2020-12-23: 200 mg via INTRAVENOUS

## 2020-12-23 MED ORDER — CEFAZOLIN SODIUM-DEXTROSE 2-4 GM/100ML-% IV SOLN
2.0000 g | INTRAVENOUS | Status: AC
  Administered 2020-12-23: 2 g via INTRAVENOUS

## 2020-12-23 MED ORDER — FENTANYL CITRATE (PF) 100 MCG/2ML IJ SOLN
INTRAMUSCULAR | Status: AC
  Filled 2020-12-23: qty 2

## 2020-12-23 MED ORDER — DEXAMETHASONE SODIUM PHOSPHATE 10 MG/ML IJ SOLN
INTRAMUSCULAR | Status: AC
  Filled 2020-12-23: qty 1

## 2020-12-23 MED ORDER — CHLORHEXIDINE GLUCONATE 0.12 % MT SOLN
15.0000 mL | OROMUCOSAL | Status: AC
  Administered 2020-12-23: 15 mL via OROMUCOSAL

## 2020-12-23 MED ORDER — EPHEDRINE SULFATE 50 MG/ML IJ SOLN
INTRAMUSCULAR | Status: DC | PRN
  Administered 2020-12-23: 5 mg via INTRAVENOUS
  Administered 2020-12-23: 10 mg via INTRAVENOUS

## 2020-12-23 MED ORDER — PHENAZOPYRIDINE HCL 200 MG PO TABS: 200.0000 mg | ORAL_TABLET | Freq: Three times a day (TID) | ORAL | 0 refills | Status: AC | PRN

## 2020-12-23 MED ORDER — HYDROMORPHONE HCL 1 MG/ML IJ SOLN
1.0000 mg | Freq: Once | INTRAMUSCULAR | Status: AC
  Administered 2020-12-23: 1 mg via INTRAVENOUS
  Filled 2020-12-23: qty 1

## 2020-12-23 MED ORDER — KETOROLAC TROMETHAMINE 30 MG/ML IJ SOLN
INTRAMUSCULAR | Status: AC
  Filled 2020-12-23: qty 1

## 2020-12-23 MED ORDER — PROPOFOL 10 MG/ML IV BOLUS
INTRAVENOUS | Status: AC
  Filled 2020-12-23: qty 20

## 2020-12-23 MED ORDER — IOHEXOL 300 MG/ML  SOLN
INTRAMUSCULAR | Status: DC | PRN
  Administered 2020-12-23: 19 mL

## 2020-12-23 MED ORDER — LACTATED RINGERS IV SOLN
INTRAVENOUS | Status: DC
Start: 2020-12-23 — End: 2020-12-24

## 2020-12-23 MED ORDER — BELLADONNA ALKALOIDS-OPIUM 16.2-30 MG RE SUPP
RECTAL | Status: AC
  Filled 2020-12-23: qty 1

## 2020-12-23 MED ORDER — CIPROFLOXACIN HCL 500 MG PO TABS: 500.0000 mg | ORAL_TABLET | Freq: Once | ORAL | 0 refills | Status: AC

## 2020-12-23 MED ORDER — LIDOCAINE 2% (20 MG/ML) 5 ML SYRINGE
INTRAMUSCULAR | Status: AC
  Filled 2020-12-23: qty 5

## 2020-12-23 MED ORDER — CEFAZOLIN SODIUM-DEXTROSE 2-4 GM/100ML-% IV SOLN
INTRAVENOUS | Status: AC
  Filled 2020-12-23: qty 100

## 2020-12-23 MED ORDER — ONDANSETRON HCL 4 MG/2ML IJ SOLN
4.0000 mg | Freq: Once | INTRAMUSCULAR | Status: AC
  Administered 2020-12-23: 4 mg via INTRAVENOUS
  Filled 2020-12-23: qty 2

## 2020-12-23 MED ORDER — LIDOCAINE 2% (20 MG/ML) 5 ML SYRINGE
INTRAMUSCULAR | Status: DC | PRN
  Administered 2020-12-23: 100 mg via INTRAVENOUS

## 2020-12-23 MED ORDER — ONDANSETRON HCL 4 MG/2ML IJ SOLN
INTRAMUSCULAR | Status: AC
  Filled 2020-12-23: qty 2

## 2020-12-23 MED ORDER — 0.9 % SODIUM CHLORIDE (POUR BTL) OPTIME
TOPICAL | Status: DC | PRN
  Administered 2020-12-23: 1000 mL

## 2020-12-23 MED ORDER — STERILE WATER FOR IRRIGATION IR SOLN
Status: DC | PRN
  Administered 2020-12-23: 3000 mL

## 2020-12-23 MED ORDER — BELLADONNA ALKALOIDS-OPIUM 16.2-60 MG RE SUPP
RECTAL | Status: DC | PRN
  Administered 2020-12-23: 1 via RECTAL

## 2020-12-23 SURGICAL SUPPLY — 11 items
BAG URO CATCHER STRL LF (MISCELLANEOUS) ×2 IMPLANT
CATH URET 5FR 28IN OPEN ENDED (CATHETERS) ×2 IMPLANT
CLOTH BEACON ORANGE TIMEOUT ST (SAFETY) ×2 IMPLANT
GLOVE SURG ENC TEXT LTX SZ7.5 (GLOVE) ×2 IMPLANT
GOWN STRL REUS W/TWL XL LVL3 (GOWN DISPOSABLE) ×2 IMPLANT
GUIDEWIRE STR DUAL SENSOR (WIRE) ×2 IMPLANT
KIT TURNOVER KIT A (KITS) ×2 IMPLANT
MANIFOLD NEPTUNE II (INSTRUMENTS) ×2 IMPLANT
PACK CYSTO (CUSTOM PROCEDURE TRAY) ×2 IMPLANT
STENT POLARIS 5FRX24 (STENTS) ×2 IMPLANT
TUBING CONNECTING 10 (TUBING) ×2 IMPLANT

## 2020-12-23 NOTE — Anesthesia Postprocedure Evaluation (Signed)
Anesthesia Post Note  Patient: Kristina Sims  Procedure(s) Performed: Duanne Moron, WITH LEFT STENT PLACEMENT (Left )     Patient location during evaluation: PACU Anesthesia Type: General Level of consciousness: awake and alert Pain management: pain level controlled Vital Signs Assessment: post-procedure vital signs reviewed and stable Respiratory status: spontaneous breathing, nonlabored ventilation, respiratory function stable and patient connected to nasal cannula oxygen Cardiovascular status: blood pressure returned to baseline and stable Postop Assessment: no apparent nausea or vomiting Anesthetic complications: no   No complications documented.  Last Vitals:  Vitals:   12/23/20 1830 12/23/20 1845  BP: 118/70 115/72  Pulse: 83 81  Resp: 12 13  Temp: 37.1 C   SpO2: 100% 98%    Last Pain:  Vitals:   12/23/20 1845  TempSrc:   PainSc: 0-No pain                 Danilo Cappiello

## 2020-12-23 NOTE — Op Note (Signed)
Preoperative diagnosis:  1. Left hydroureteronephrosis  Postoperative diagnosis:  1. Same  Procedure:  1. Cystoscopy 2. left, right ureteral stent placement 3. left retrograde pyelography with interpretation   Surgeon: Crist Fat, MD  Anesthesia: General  Complications: None  Intraoperative findings:  #1: The retrograde pyelogram demonstrated that the patient's ureter was dilated down to the bladder where she had some edema preventing the ureter from draining completely.  The ureter was quite distended and torturous.    #2: A 5 French x24 cm Polaris stent was placed without complication.  EBL: Minimal  Specimens: None  Indication: Kristina Sims is a 50 y.o. patient with solitary left kidney who had an elective stone procedure on Friday.  She removed her stent Monday morning and subsequently developed acute onset left-sided renal colic that waxed and waned but the pain persisted. After reviewing the management options for treatment, he elected to proceed with the above surgical procedure(s). We have discussed the potential benefits and risks of the procedure, side effects of the proposed treatment, the likelihood of the patient achieving the goals of the procedure, and any potential problems that might occur during the procedure or recuperation. Informed consent has been obtained.  Description of procedure:  The patient was taken to the operating room and general anesthesia was induced.  The patient was placed in the dorsal lithotomy position, prepped and draped in the usual sterile fashion, and preoperative antibiotics were administered. A preoperative time-out was performed.   Cystourethroscopy was performed.  The patient's urethra was examined and was normal. The bladder was then systematically examined in its entirety. There was no evidence for any bladder tumors, stones, or other mucosal pathology.  There was bullous edema at the left ureteral  orifice.  Attention then turned to the leftureteral orifice and a ureteral catheter was used to intubate the ureteral orifice.  Omnipaque contrast was injected through the ureteral catheter and a retrograde pyelogram was performed with findings as dictated above.  A 0.38 sensor guidewire was then advanced up the left ureter into the renal pelvis under fluoroscopic guidance.  The wire was then backloaded through the cystoscope and a ureteral stent was advance over the wire using Seldinger technique.  The stent was positioned appropriately under fluoroscopic and cystoscopic guidance.  The wire was then removed with an adequate stent curl noted in the renal pelvis as well as in the bladder.  The bladder was then emptied and the procedure ended.  The patient appeared to tolerate the procedure well and without complications.  The patient was able to be awakened and transferred to the recovery unit in satisfactory condition.    Crist Fat, M.D.

## 2020-12-23 NOTE — Interval H&P Note (Signed)
History and Physical Interval Note:  12/23/2020 5:45 PM  Kristina Sims  has presented today for surgery, with the diagnosis of LEFT HYDROPHROSIS.  The various methods of treatment have been discussed with the patient and family. After consideration of risks, benefits and other options for treatment, the patient has consented to  Procedure(s): CYSTOSCOPY WITH LEFT STENT PLACEMENT (Left) as a surgical intervention.  The patient's history has been reviewed, patient examined, no change in status, stable for surgery.  I have reviewed the patient's chart and labs.  Questions were answered to the patient's satisfaction.     Crist Fat

## 2020-12-23 NOTE — Anesthesia Procedure Notes (Signed)
Procedure Name: LMA Insertion °Performed by: Corrion Stirewalt H, CRNA °Pre-anesthesia Checklist: Patient identified, Emergency Drugs available, Suction available and Patient being monitored °Patient Re-evaluated:Patient Re-evaluated prior to induction °Oxygen Delivery Method: Circle System Utilized °Preoxygenation: Pre-oxygenation with 100% oxygen °Induction Type: IV induction °Ventilation: Mask ventilation without difficulty °LMA: LMA inserted °LMA Size: 4.0 °Number of attempts: 1 °Airway Equipment and Method: Bite block °Placement Confirmation: positive ETCO2 °Tube secured with: Tape °Dental Injury: Teeth and Oropharynx as per pre-operative assessment  ° ° ° ° ° ° °

## 2020-12-23 NOTE — Discharge Instructions (Signed)
DISCHARGE INSTRUCTIONS FOR KIDNEY STONE/URETERAL STENT   MEDICATIONS:  1.  Resume all your other meds from home - except do not take any extra narcotic pain meds that you may have at home.  2. Pyridium is to help with the burning/stinging when you urinate. 3. Tramadol is for moderate/severe pain, otherwise taking upto 1000 mg every 6 hours of plainTylenol will help treat your pain.   4. Take Cipro one hour prior to removal of your stent.   ACTIVITY:  1. No strenuous activity x 1week  2. No driving while on narcotic pain medications  3. Drink plenty of water  4. Continue to walk at home - you can still get blood clots when you are at home, so keep active, but don't over do it.  5. May return to work/school tomorrow or when you feel ready   BATHING:  1. You can shower and we recommend daily showers  2. You have a string coming from your urethra: The stent string is attached to your ureteral stent. Do not pull on this.   SIGNS/SYMPTOMS TO CALL:  Please call us if you have a fever greater than 101.5, uncontrolled nausea/vomiting, uncontrolled pain, dizziness, unable to urinate, bloody urine, chest pain, shortness of breath, leg swelling, leg pain, redness around wound, drainage from wound, or any other concerns or questions.   You can reach [place] at [phone].   FOLLOW-UP:  1. You have an appointment in 6 weeks with a ultrasound of your kidneys prior.  2. You have a string attached to your stent, you may remove it on Monday, April 18th. To do this, pull the strings until the stents are completely removed. You may feel an odd sensation in your back.

## 2020-12-23 NOTE — Discharge Instructions (Addendum)
Call Dr. Belva Crome office at 8 AM to make arrangements to be seen and have your stent replaced.  His contact information has been provided in this discharge summary for you to call and make these arrangements.  Continue taking Percocet as previously prescribed.  If your pain returns and is unrelieved with the Percocet, go to the [facility] emergency department to be seen.

## 2020-12-23 NOTE — Anesthesia Preprocedure Evaluation (Addendum)
Anesthesia Evaluation  Patient identified by MRN, date of birth, ID band Patient awake    Reviewed: Allergy & Precautions, NPO status , Patient's Chart, lab work & pertinent test results  History of Anesthesia Complications Negative for: history of anesthetic complications  Airway Mallampati: I  TM Distance: >3 FB Neck ROM: Full    Dental  (+) Teeth Intact, Dental Advisory Given   Pulmonary    Pulmonary exam normal        Cardiovascular Normal cardiovascular exam+ dysrhythmias      Neuro/Psych PSYCHIATRIC DISORDERS Anxiety Depression    GI/Hepatic   Endo/Other    Renal/GU Renal diseaseRenal agenesis L renal stone     Musculoskeletal   Abdominal Normal abdominal exam  (+)   Peds  Hematology negative hematology ROS (+)   Anesthesia Other Findings   Reproductive/Obstetrics                            Anesthesia Physical  Anesthesia Plan  ASA: II  Anesthesia Plan: General   Post-op Pain Management:    Induction: Intravenous  PONV Risk Score and Plan: 3 and Ondansetron, Dexamethasone, Midazolam and Treatment may vary due to age or medical condition  Airway Management Planned: LMA  Additional Equipment: None  Intra-op Plan:   Post-operative Plan: Extubation in OR  Informed Consent: I have reviewed the patients History and Physical, chart, labs and discussed the procedure including the risks, benefits and alternatives for the proposed anesthesia with the patient or authorized representative who has indicated his/her understanding and acceptance.     Dental advisory given  Plan Discussed with: Anesthesiologist and CRNA  Anesthesia Plan Comments:         Anesthesia Quick Evaluation

## 2020-12-23 NOTE — Transfer of Care (Signed)
Immediate Anesthesia Transfer of Care Note  Patient: Kristina Sims  Procedure(s) Performed: Duanne Moron, WITH LEFT STENT PLACEMENT (Left )  Patient Location: PACU  Anesthesia Type:General  Level of Consciousness: awake  Airway & Oxygen Therapy: Patient Spontanous Breathing  Post-op Assessment: Report given to RN and Post -op Vital signs reviewed and stable  Post vital signs: Reviewed and stable  Last Vitals:  Vitals Value Taken Time  BP 118/70 12/23/20 1830  Temp    Pulse 85 12/23/20 1830  Resp 14 12/23/20 1830  SpO2 99 % 12/23/20 1830  Vitals shown include unvalidated device data.  Last Pain:  Vitals:   12/23/20 1523  TempSrc: Oral  PainSc: 4          Complications: No complications documented.

## 2020-12-23 NOTE — ED Triage Notes (Signed)
C/o unable to urinate and spasms in back from stent removal. C/o nausea. Denies fever. Took oxycodone @ 0100.

## 2020-12-23 NOTE — ED Provider Notes (Signed)
[facility] EMERGENCY DEPT Provider Note   CSN: [id number] Arrival date & time: 12/23/20  8546     History Chief Complaint  Patient presents with  . Urinary Retention  . Spasms    Kristina Sims is a 50 y.o. female.  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 Annabell Howells.  Patient had a ureteral stent in place until approximately 18 hours ago.  She developed suprapubic and left flank pain along with an inability to urinate yesterday evening.  She was seen here, however was able to urinate and symptoms resolved.  Her urinalysis showed positive nitrate, but was felt not to reflect infection.  She returns this evening again with an inability to urinate and suprapubic spasms, cramping.  She denies fevers or chills.  She denies any bowel complaints.  The history is provided by the patient.       Past Medical History:  Diagnosis Date  . Anxiety   . Chronic kidney disease    Renal Agenesis  . Depression   . Dysrhythmia 2017   stress ,coffee and dehydration  . History of kidney stones   . Palpitations 11/27/2015  . RLD (ruptured lumbar disc)   . Solitary kidney     Patient Active Problem List   Diagnosis Date Noted  . Palpitations 11/27/2015  . Hypothyroidism 11/27/2015    Past Surgical History:  Procedure Laterality Date  . BACK SURGERY  2015   L5-S1  . CYSTOSCOPY WITH URETEROSCOPY Left 12/19/2020   Procedure: CYSTOSCOPY WITH URETEROSCOPY;  Surgeon: Bjorn Pippin, MD;  Location: [facility] ORS;  Service: Urology;  Laterality: Left;     OB History   No obstetric history on file.     Family History  Problem Relation Age of Onset  . Diverticulitis Mother        Had resection  . Dementia Mother   . Bradycardia Mother   . Colon polyps Paternal Grandfather   . Atrial fibrillation Father   . Thyroid disease Father   . Peripheral Artery  Disease Maternal Grandmother   . Alzheimer's disease Maternal Grandfather     Social History   Tobacco Use  . Smoking status: Never Smoker  . Smokeless tobacco: Never Used  Vaping Use  . Vaping Use: Never used  Substance Use Topics  . Alcohol use: Yes    Comment: glass daily  . Drug use: No    Home Medications Prior to Admission medications   Medication Sig Start Date End Date Taking? Authorizing Provider  estradiol (VIVELLE-DOT) 0.05 MG/24HR patch Place 1 patch onto the skin 2 (two) times a week. Fridays & Mondays 11/30/20   [provider]  ibuprofen (ADVIL) 200 MG tablet Take 400 mg by mouth every 8 (eight) hours as needed (pain).    [provider]  ondansetron (ZOFRAN ODT) 4 MG disintegrating tablet Take 1 tablet (4 mg total) by mouth every 8 (eight) hours as needed for up to 10 doses for nausea or vomiting. 12/04/20   Sabino Donovan, MD  oxyCODONE (ROXICODONE) 5 MG immediate release tablet Take 1 tablet (5 mg total) by mouth every 6 (six) hours as needed for moderate pain or severe pain. 12/19/20 12/19/21  Bjorn Pippin, MD  phenazopyridine (PYRIDIUM) 200 MG tablet Take 1 tablet (200 mg total) by mouth 3 (three) times daily as needed for pain. 12/19/20   Bjorn Pippin, MD  progesterone (PROMETRIUM) 100  MG capsule Take 200 mg by mouth at bedtime. 10/22/20   [provider]  sertraline (ZOLOFT) 100 MG tablet Take 100 mg by mouth in the morning.    [provider]    Allergies    Patient has no known allergies.  Review of Systems   Review of Systems  All other systems reviewed and are negative.   Physical Exam Updated Vital Signs BP (!) 143/87   Pulse 81   Temp 98.4 F (36.9 C)   Resp 17   Ht 5\' 8"  (1.727 m)   Wt 65.8 kg   SpO2 98%   BMI 22.05 kg/m   Physical Exam Vitals and nursing note reviewed.  Constitutional:      General: She is not in acute distress.    Appearance: She is well-developed. She is not diaphoretic.  HENT:     Head:  Normocephalic and atraumatic.  Cardiovascular:     Rate and Rhythm: Normal rate and regular rhythm.     Heart sounds: No murmur heard. No friction rub. No gallop.   Pulmonary:     Effort: Pulmonary effort is normal. No respiratory distress.     Breath sounds: Normal breath sounds. No wheezing.  Abdominal:     General: Bowel sounds are normal. There is no distension.     Palpations: Abdomen is soft.     Tenderness: There is abdominal tenderness. There is left CVA tenderness.     Comments: There is suprapubic tenderness present.  Musculoskeletal:        General: Normal range of motion.     Cervical back: Normal range of motion and neck supple.  Skin:    General: Skin is warm and dry.  Neurological:     Mental Status: She is alert and oriented to person, place, and time.     ED Results / Procedures / Treatments   Labs (all labs ordered are listed, but only abnormal results are displayed) Labs Reviewed  BASIC METABOLIC PANEL  CBC WITH DIFFERENTIAL/PLATELET    EKG None  Radiology No results found.  Procedures Procedures   Medications Ordered in ED Medications  HYDROmorphone (DILAUDID) injection 1 mg (has no administration in time range)  ondansetron (ZOFRAN) injection 4 mg (has no administration in time range)    ED Course  I have reviewed the triage vital signs and the nursing notes.  Pertinent labs & imaging results that were available during my care of the patient were reviewed by me and considered in my medical decision making (see chart for details).    MDM Rules/Calculators/A&P  Patient presenting here with suprapubic and left flank pain.  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  She was diagnosed with a kidney stone last week, then had lithotripsy and stent placement.  She presents this evening for the second time complaining of these spasms.  Her CT scan shows moderate to marked hydronephrosis of the left kidney, but no  identifiable stone.  Patient's care discussed with Dr. from urology.  We are in agreement that there is likely swelling or inflammation of the ureter causing this hydronephrosis and that patient will require stent replacement.  At the advice of Dr. Annabell Howells, she is to remain n.p.o., then call the office at 8 AM to make arrangements to be seen and have her stent replaced.  Final Clinical Impression(s) / ED Diagnoses Final diagnoses:  None    Rx / DC Orders ED Discharge Orders  None       Geoffery Lyons, MD 12/23/20 [phone]

## 2020-12-23 NOTE — H&P (Signed)
50 year old female with a solitary kidney presents today with left-sided renal colic.  The patient had a left-sided nonobstructing stone and had surgery on Friday for the stone removal.  She had a stent placed at the time.  She was then instructed to remove the stent on Monday.  Following stent removal the patient noted intense left-sided flank pain with associated nausea and vomiting.  The pain is waxed and waned, but been persistent throughout.  Currently it is 10/10.  She has been to the urgent care facility late last night where she had a renal ultrasound that demonstrated left-sided hydroureteronephrosis.  She denies any fevers or chills.  She denies any dysuria.  She has made some urine over the course of the last 12 hours.    ALLERGIES: No Allergies    MEDICATIONS: Estradiol (Twice Weekly)  Progesterone 200 mg capsule  Sertraline Hcl 50 mg tablet Oral     GU PSH: No GU PSH      PSH Notes: L-4 L-5 surgery 2015   NON-GU PSH: Back surgery - about 2016     GU PMH: Renal calculus - 12/10/2020, - 06/05/2020, - 01/28/2020 Acute Cystitis/UTI - 06/13/2020 Gross hematuria - 06/13/2020, (Stable), - 06/05/2020, - 01/28/2020, - 12/13/2019, Gross hematuria, - 2014 History of urolithiasis - 06/13/2020, - 12/13/2019 Flank Pain, Generalized abdominal pain - 2014 Other microscopic hematuria, Microscopic hematuria - 2014 Ureteral calculus, Ureteral Stone - 2014, Proximal Ureteral Stone On The Left, - 2014      PMH Notes:  1898-09-13 00:00:00 - Note: Normal Routine History And Physical Adult  2012-06-09 10:41:18 - Note: Nephrolithiasis Of The Left Kidney   NON-GU PMH: Acquired absence of kidney, Right - 06/05/2020, Right, Solitary kidney, acquired, - 2014 Anxiety, Anxiety (Symptom) - 2014 Personal history of other diseases of the digestive system, History of esophageal reflux - 2014 Personal history of other mental and behavioral disorders, History of depression - 2014 Renal agenesis, unspecified,  Congenital Absence Of Kidney - 2014 Arthritis GERD    FAMILY HISTORY: Alzheimer's Disease - Mother Family Health Status Number - Runs In Family nephrolithiasis - Father, Grandfather, Grandmother   SOCIAL HISTORY: Marital Status: Married Preferred Language: English; Race: White Current Smoking Status: Patient has never smoked.   Tobacco Use Assessment Completed: Used Tobacco in last 30 days? Drinks 1 drink per day. Types of alcohol consumed: Wine.  Drinks 2 caffeinated drinks per day. Patient's occupation Special educational needs teacher.     Notes: Never A Smoker, Alcohol Use, Caffeine Use, Tobacco Use, Marital History - Currently Married, Occupation: 1 son 1 daughter   REVIEW OF SYSTEMS:    GU Review Female:   Patient denies frequent urination, hard to postpone urination, burning /pain with urination, get up at night to urinate, leakage of urine, stream starts and stops, trouble starting your stream, have to strain to urinate, and being pregnant.  Gastrointestinal (Upper):   Patient denies nausea, vomiting, and indigestion/ heartburn.  Gastrointestinal (Lower):   Patient denies constipation and diarrhea.  Constitutional:   Patient denies fever, night sweats, weight loss, and fatigue.  Skin:   Patient denies skin rash/ lesion and itching.  Eyes:   Patient denies blurred vision and double vision.  Ears/ Nose/ Throat:   Patient denies sore throat and sinus problems.  Hematologic/Lymphatic:   Patient denies swollen glands and easy bruising.  Cardiovascular:   Patient denies leg swelling and chest pains.  Respiratory:   Patient denies cough and shortness of breath.  Endocrine:   Patient denies excessive  thirst.  Musculoskeletal:   Patient denies back pain and joint pain.  Neurological:   Patient denies headaches and dizziness.  Psychologic:   Patient denies depression and anxiety.   VITAL SIGNS:      12/23/2020 11:05 AM  Weight 145 lb / 65.77 kg  BP 120/79 mmHg  Pulse 91 /min   Temperature 97.3 F / 36.2 C   PAST DATA REVIEW: None   PROCEDURES:          Ketoralac 30mg  - J1885, [id number] 0.5 ml administered; 0.5 ml remaining discarded -[facility], CMA   Qty: 15 Adm. By:  Unit: mg Lot No Lyndal Rainbow  Route: IM Exp. Date 04/13/2022  Freq: None Mfgr.:   Site: Left Buttock         Phenergan 25mg  - 06/13/2022, J2550 Qty: 25 Adm. By:  Unit: mg Lot No [id number]  Route: IM Exp. Date 01/11/2022  Freq: None Mfgr.:   Site: Right Buttock        Notes:   15mg  Ketoralac/ 25mg  Pheneragan   ASSESSMENT: None   PLAN:           Document Letter(s):  Created for Patient: Clinical Summary         Notes:   The patient has a solitary kidney. Her stent was removed yesterday and she has had intermittent renal colic since that time. She was seen in the emergency department this morning was noted to have severe hydronephrosis. She is in quite a bit of pain as well as severe nausea.   The patient was given 15 mg of IM Toradol and 25 mg of Phenergan. We will plan to replace her stent tonight. I went through this with her and her father, a retired [id number], and they both are in agreement with this plan.

## 2020-12-24 ENCOUNTER — Encounter (HOSPITAL_COMMUNITY): Payer: Self-pay | Admitting: Urology

## 2020-12-24 LAB — URINE CULTURE: Culture: NO GROWTH

## 2021-09-08 ENCOUNTER — Ambulatory Visit: Payer: BLUE CROSS/BLUE SHIELD | Attending: Internal Medicine

## 2021-09-08 DIAGNOSIS — Z23 Encounter for immunization: Secondary | ICD-10-CM

## 2021-09-09 NOTE — Progress Notes (Signed)
° °  Covid-19 Vaccination Clinic  Name:  Kristina Sims    MRN: [id number] DOB: [birth year]  09/09/2021  Kristina Sims was observed post Covid-19 immunization for 15 minutes without incident. She was provided with Vaccine Information Sheet and instruction to access the V-Safe system.   Kristina Sims was instructed to call [phone] with any severe reactions post vaccine: Difficulty breathing  Swelling of face and throat  A fast heartbeat  A bad rash all over body  Dizziness and weakness   Immunizations Administered     Name Date Dose VIS Date Route   Pfizer Covid-19 Vaccine Bivalent Booster 09/08/2021  5:15 PM 0.3 mL 05/13/2021 Intramuscular   Manufacturer: [employer], [employer]   Lot: [id number]   NDC: [phone]

## 2023-03-07 IMAGING — CT CT RENAL STONE PROTOCOL
1 of 2 series · 15 of 32 positions shown, 19 images · non-contrast
Comparison: None.

CLINICAL DATA: Left flank pain

EXAM:
CT ABDOMEN AND PELVIS WITHOUT CONTRAST
TECHNIQUE: Multidetector CT imaging of the abdomen and pelvis was performed
following the standard protocol without oral or IV contrast.

[Series 2: stone full · axial · 0.74mm/px · z∈[+929,+1329]mm · 15 of 88 slices shown, 19 images]
[im 4/88  soft-tissue]
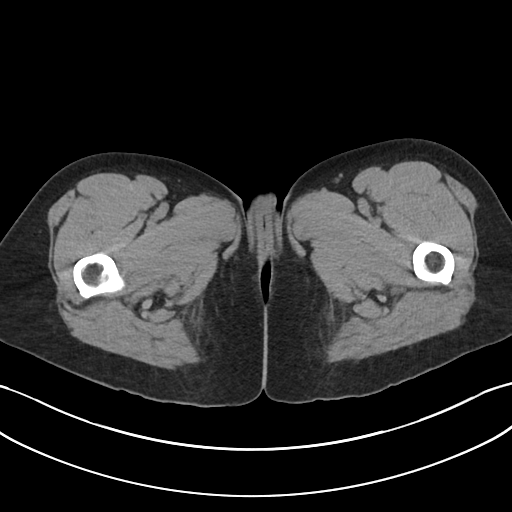
[im 4/88  bone]
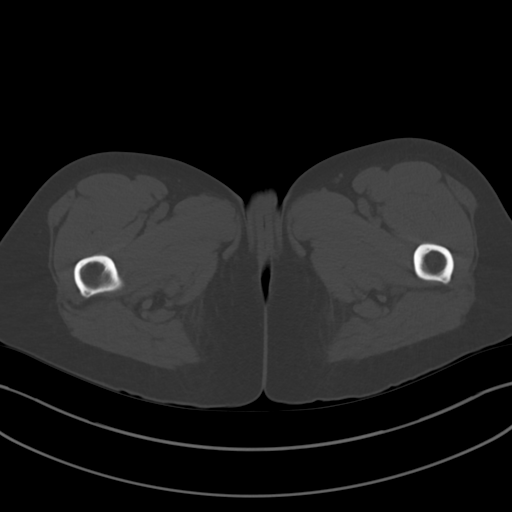
[im 11/88  soft-tissue]
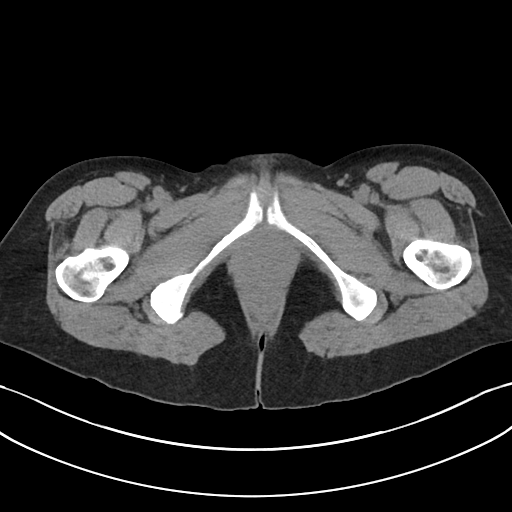
[im 18/88  soft-tissue]
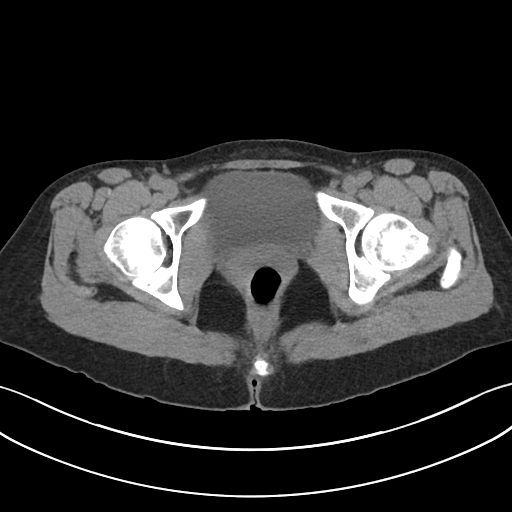
[im 25/88  soft-tissue]
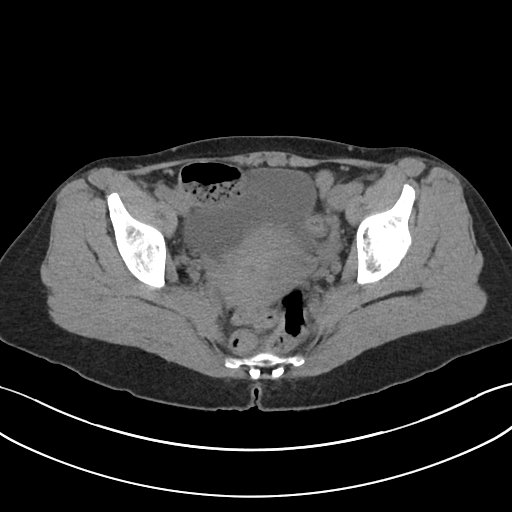
[im 32/88  soft-tissue]
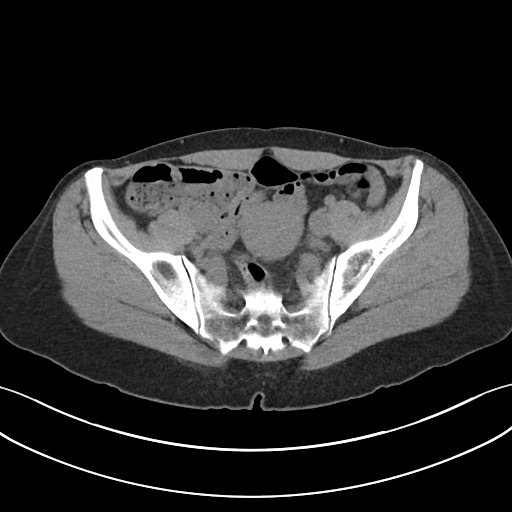
[im 39/88  soft-tissue]
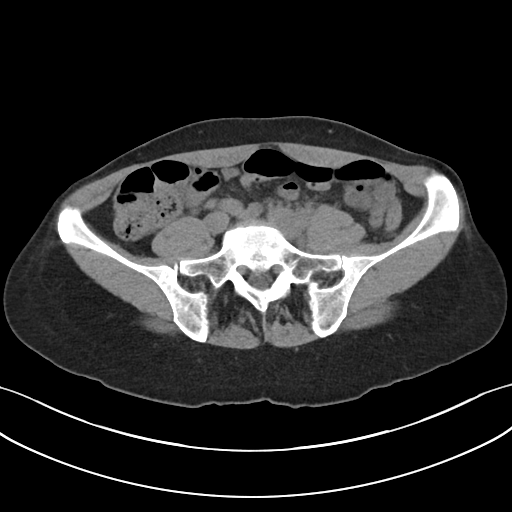
[im 46/88  soft-tissue]
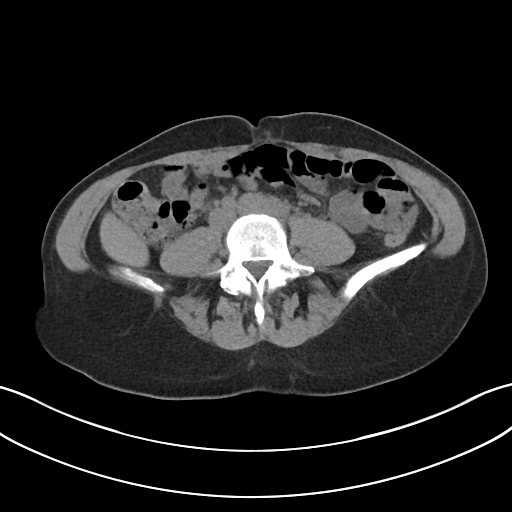
[im 49/88  soft-tissue]
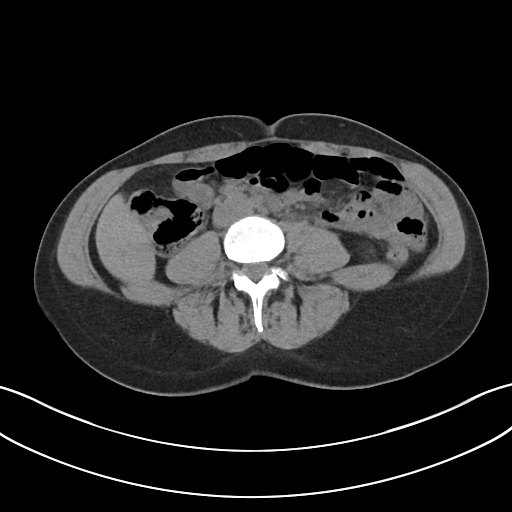
[im 56/88  soft-tissue]
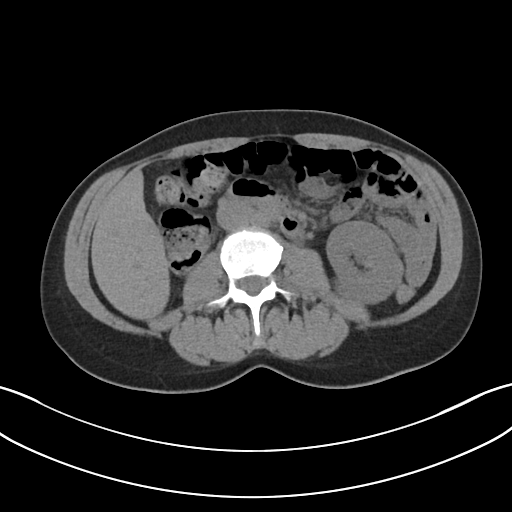
[im 56/88  bone]
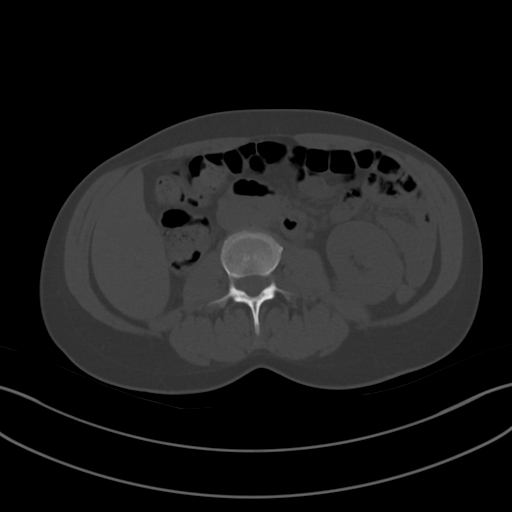
[im 63/88  soft-tissue]
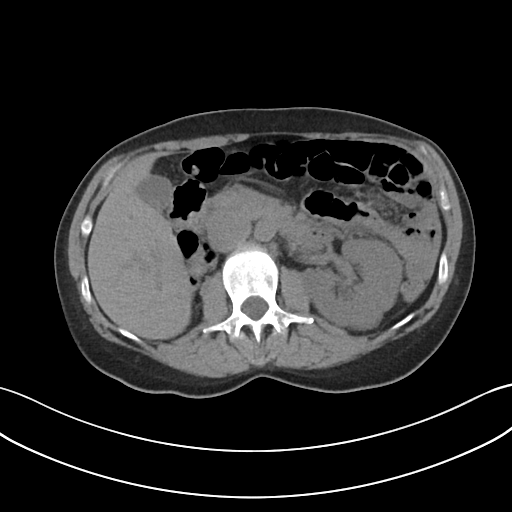
[im 70/88  soft-tissue]
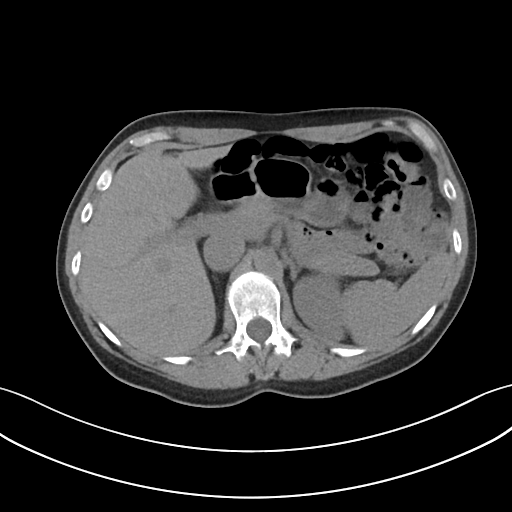
[im 74/88  lung]
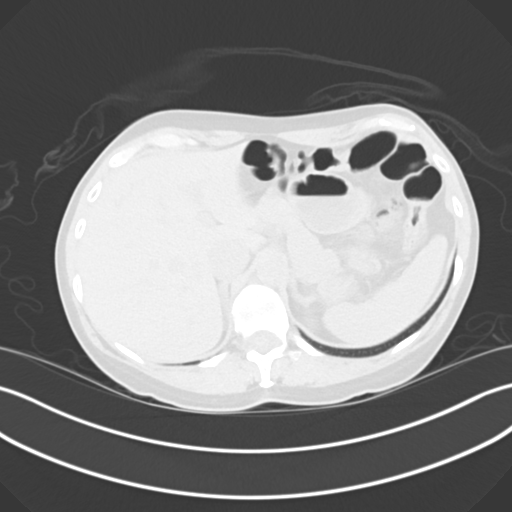
[im 77/88  soft-tissue]
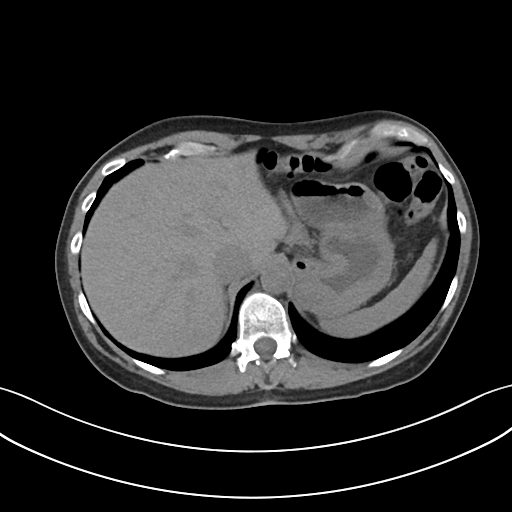
[im 77/88  lung]
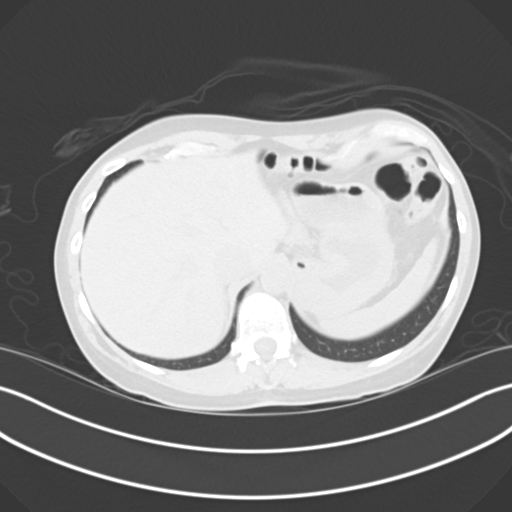
[im 81/88  lung]
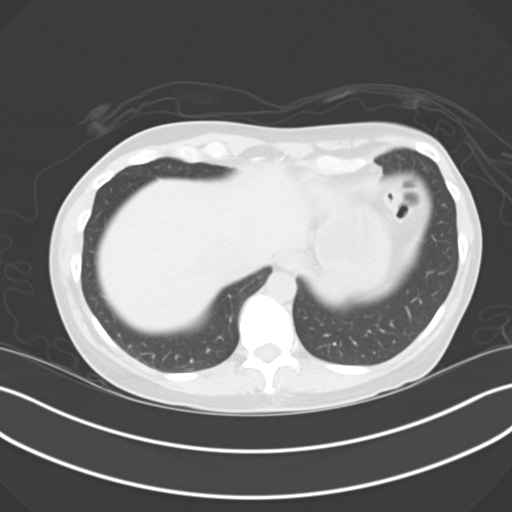
[im 84/88  soft-tissue]
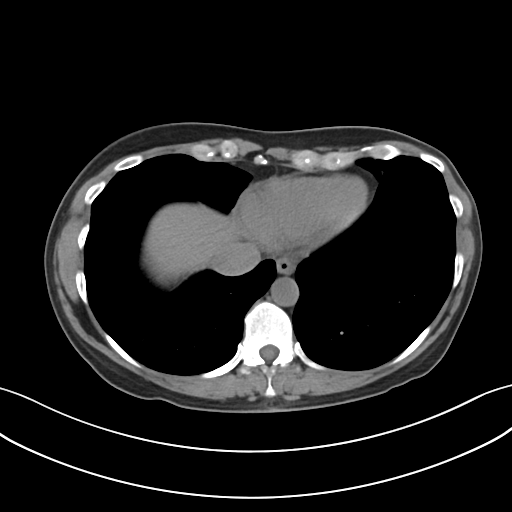
[im 84/88  lung]
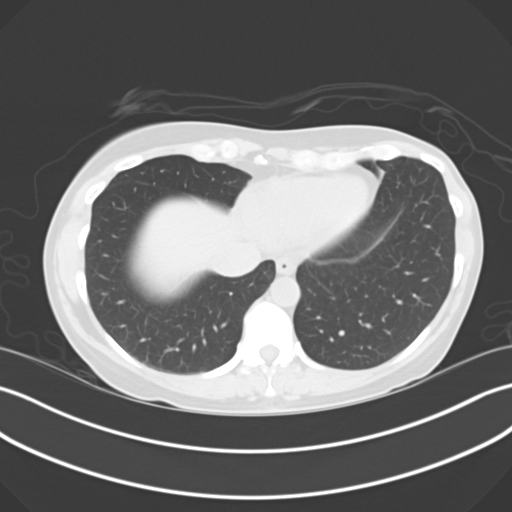

[15 of 32 positions shown; findings below may reference images not displayed]

FINDINGS: Lower chest: Lung bases are clear.

Hepatobiliary: Liver measures 20.5 cm in length. Left lobe of the
liver is relatively hypoplastic. No focal liver lesions are evident
on this noncontrast enhanced study. Gallbladder wall is not
appreciably thickened. There is no evident biliary duct dilatation.

Pancreas: There is no pancreatic mass or inflammatory focus.

Spleen: No splenic lesions are evident.

Adrenals/Urinary Tract: Adrenals bilaterally appear normal. Absent
right kidney. There is apparent compensatory hypertrophy of the left
kidney. No left renal mass. There is mild hydronephrosis on the
left. There is no intrarenal calculus on the left. There is a focal
calculus at the left ureteropelvic junction measuring 6 x 5 mm. No
other ureteral calculus is evident. Urinary bladder is midline with
wall thickness within normal limits.

Stomach/Bowel: There is no appreciable bowel wall or mesenteric
thickening. No evident bowel obstruction. Terminal ileum appears
normal. Appendix region appears normal. No evident free air or
portal venous air.

Vascular/Lymphatic: There is no abdominal aortic aneurysm. No
vascular lesions are appreciable on this noncontrast enhanced study.
There is no appreciable adenopathy in the abdomen or pelvis.

Reproductive: The uterus is anteverted. No adnexal masses are
evident.

Other: No evident abscess or ascites in the abdomen or pelvis.

Musculoskeletal: No blastic or lytic bone lesions. No intramuscular
or abdominal
IMPRESSION: 1. 6 x 5 mm calculus at the left ureteropelvic junction with mild
hydronephrosis on the left.

2. Absent right kidney. Apparent compensatory hypertrophy left
kidney.

3.  Liver mildly prominent without focal liver lesion evident.

4. No bowel wall thickening or bowel obstruction. No abscess in the
abdomen or pelvis. No appendiceal region inflammation.

## 2023-03-26 IMAGING — CT CT RENAL STONE PROTOCOL
1 of 2 series · 15 of 32 positions shown, 19 images · non-contrast
Comparison: December 04, 2020

CLINICAL DATA: Back spasms status post stent removal.

EXAM:
CT ABDOMEN AND PELVIS WITHOUT CONTRAST
TECHNIQUE: Multidetector CT imaging of the abdomen and pelvis was performed
following the standard protocol without IV contrast.

[Series 2: stone full · axial · 0.68mm/px · z∈[-921,-521]mm · 15 of 88 slices shown, 19 images]
[im 4/88  soft-tissue]
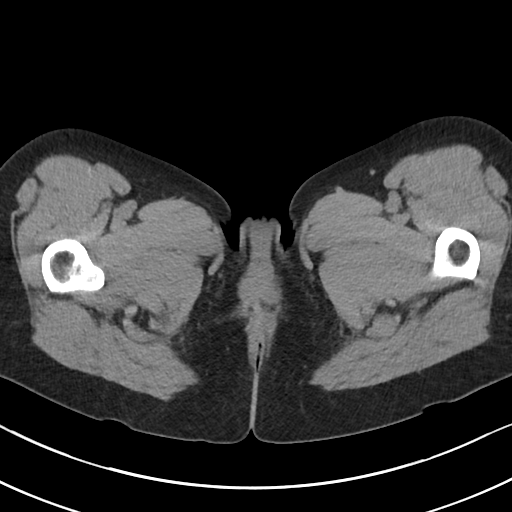
[im 4/88  bone]
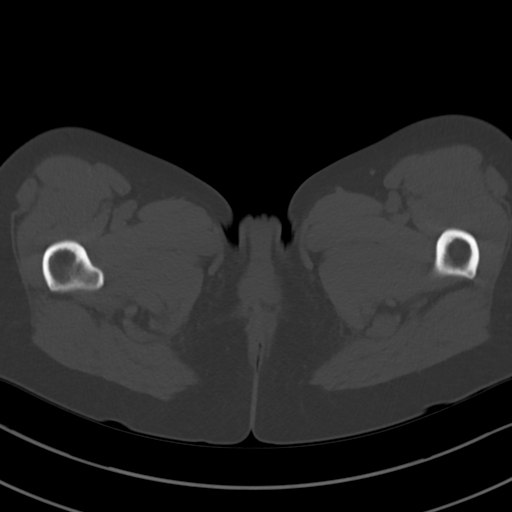
[im 12/88  soft-tissue]
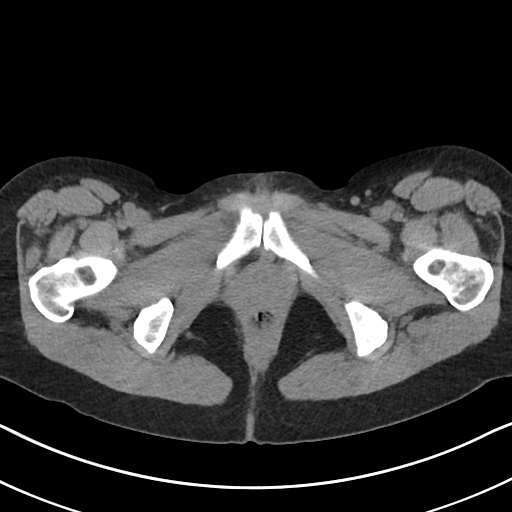
[im 20/88  soft-tissue]
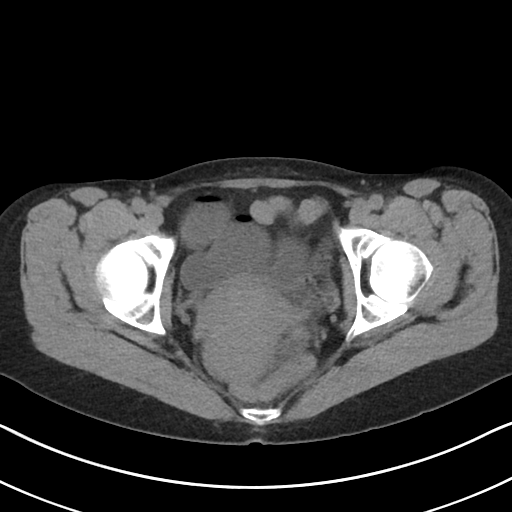
[im 24/88  soft-tissue]
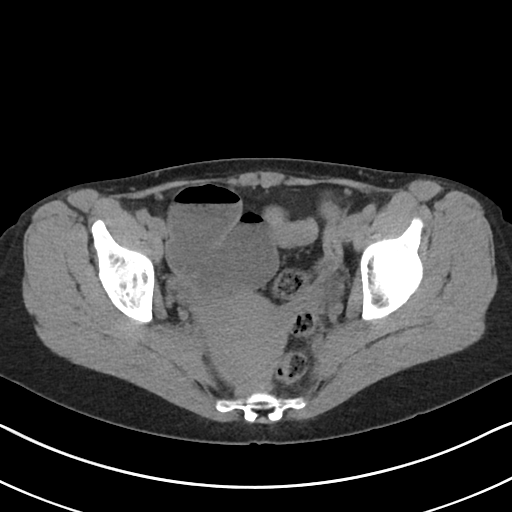
[im 32/88  soft-tissue]
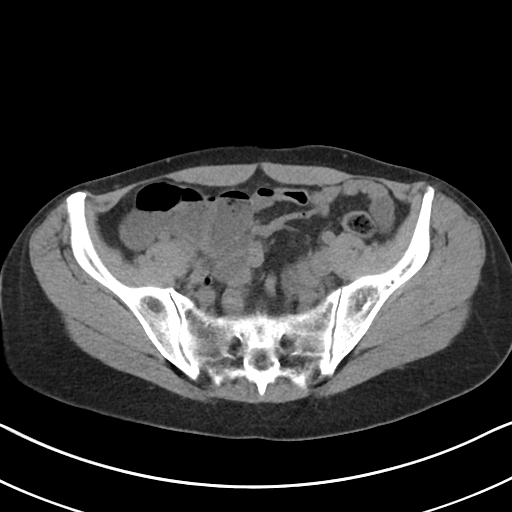
[im 36/88  soft-tissue]
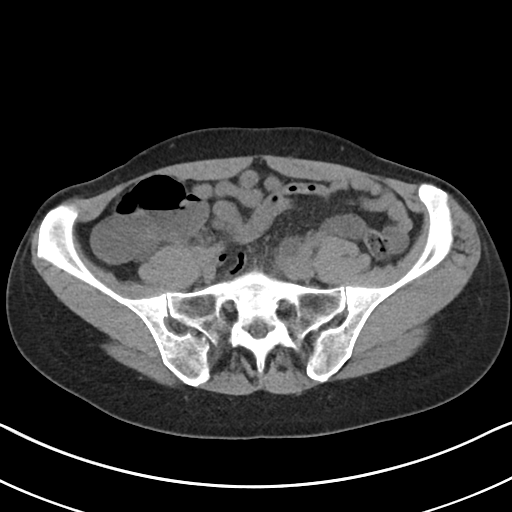
[im 44/88  soft-tissue]
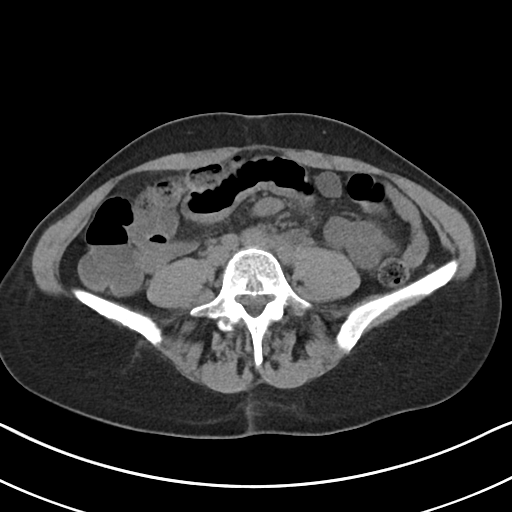
[im 52/88  soft-tissue]
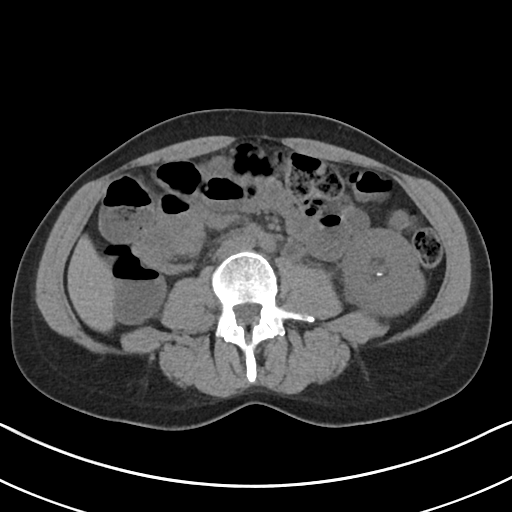
[im 56/88  soft-tissue]
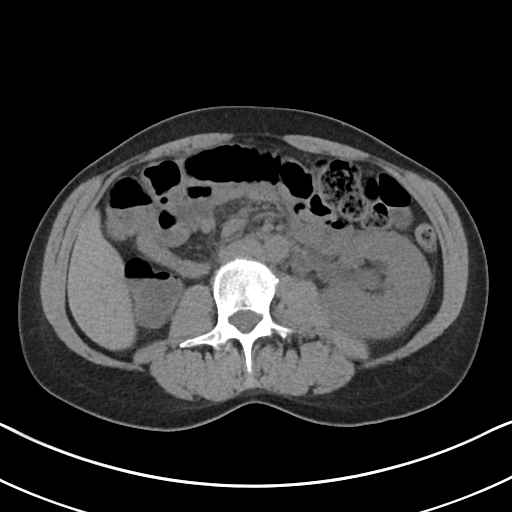
[im 56/88  bone]
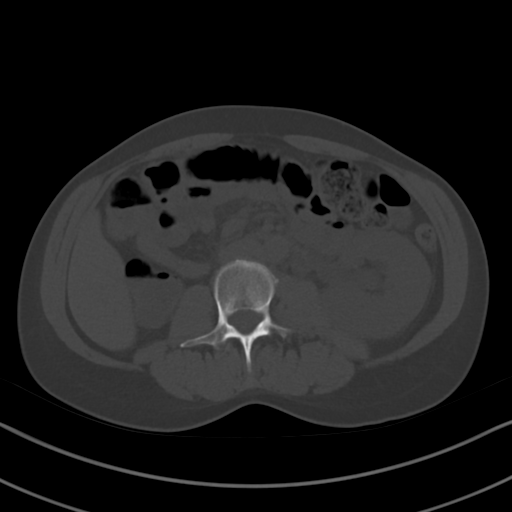
[im 64/88  soft-tissue]
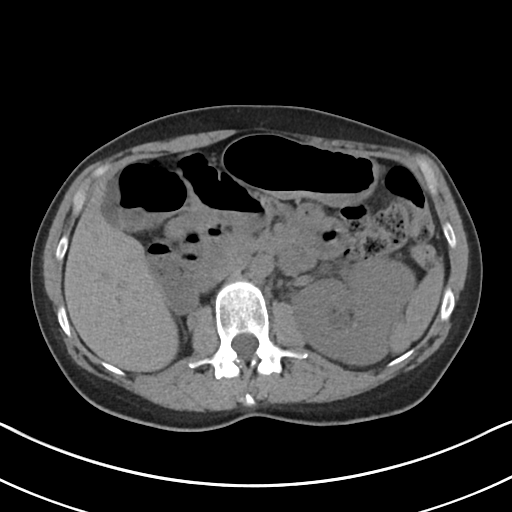
[im 68/88  soft-tissue]
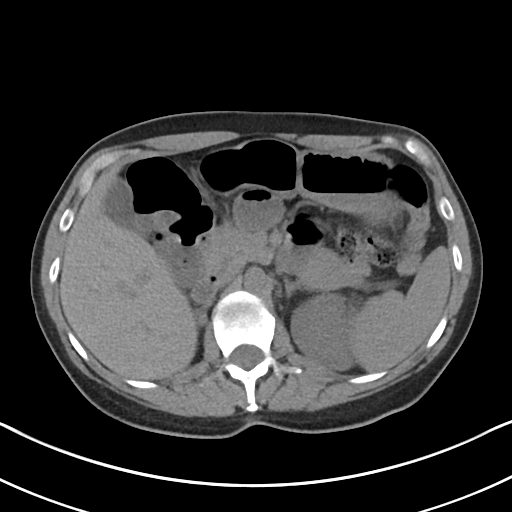
[im 72/88  lung]
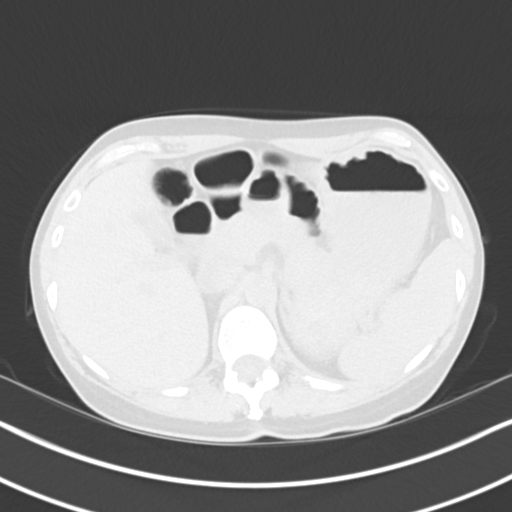
[im 76/88  soft-tissue]
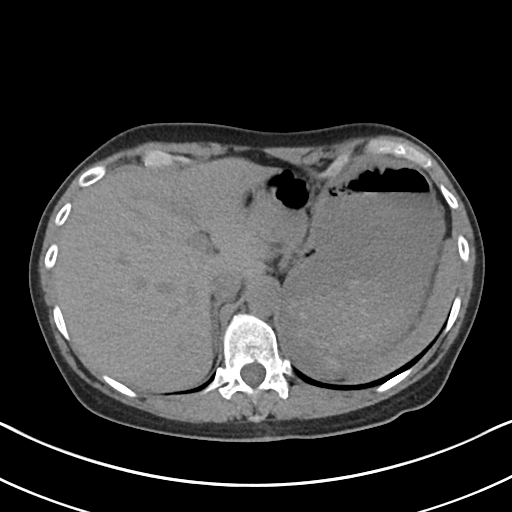
[im 76/88  lung]
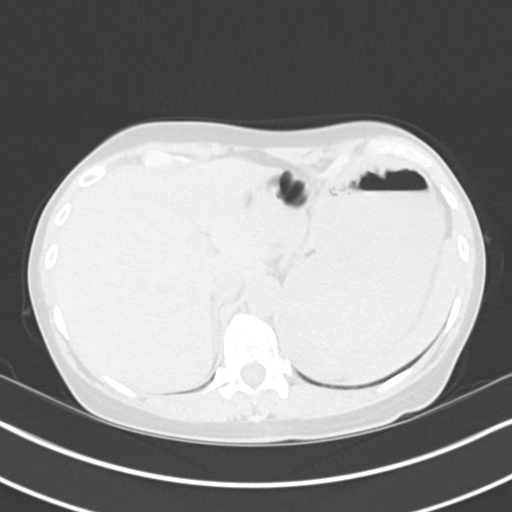
[im 80/88  lung]
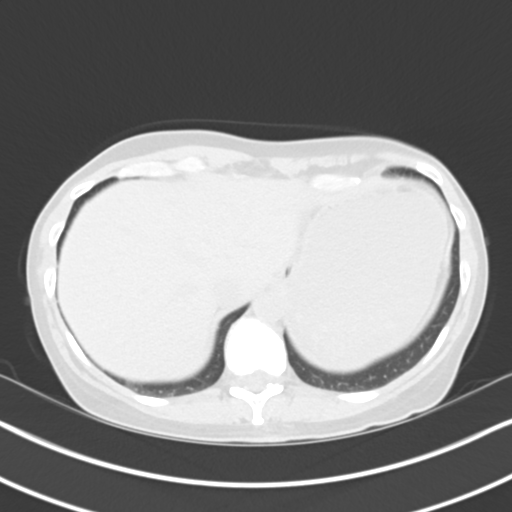
[im 84/88  soft-tissue]
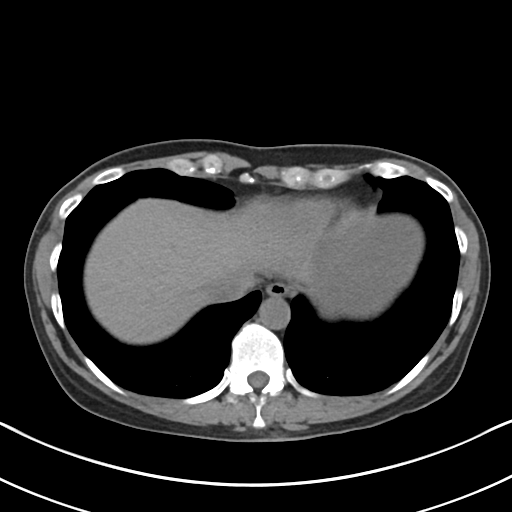
[im 84/88  lung]
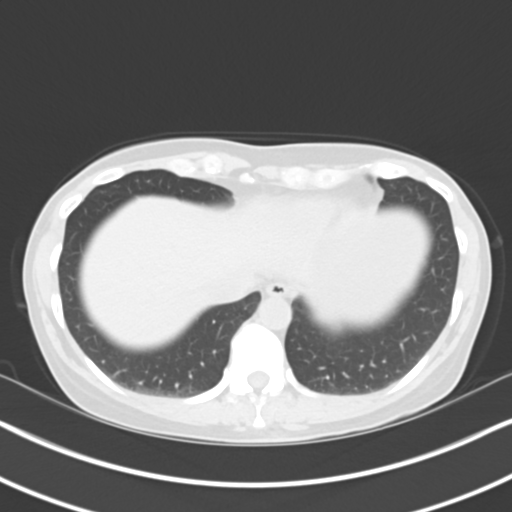

[15 of 32 positions shown; findings below may reference images not displayed]

FINDINGS: Lower chest: No acute abnormality.

Hepatobiliary: No focal liver abnormality is seen. No gallstones,
gallbladder wall thickening, or biliary dilatation.

Pancreas: Unremarkable. No pancreatic ductal dilatation or
surrounding inflammatory changes.

Spleen: Normal in size without focal abnormality.

Adrenals/Urinary Tract: Adrenal glands are unremarkable. The right
kidney is absent. Compensatory hypertrophy of the left kidney is
seen. A 2 mm nonobstructing renal stone is seen within the mid left
kidney. An additional 4 mm nonobstructing renal stone is noted
within the lower pole of the left kidney. Moderate to marked
severity left-sided hydronephrosis and hydroureter are seen, to the
level of the urinary bladder. This is increased in severity when
compared to the prior exam. No obstructing renal calculi are
identified. Bladder is unremarkable.

Stomach/Bowel: Stomach is within normal limits. Appendix appears
normal. Small bowel loops within the mid and upper abdomen are
mildly dilated. No clear transition zone is identified.

Vascular/Lymphatic: No significant vascular findings are present. No
enlarged abdominal or pelvic lymph nodes.

Reproductive: Uterus and bilateral adnexa are unremarkable.

Other: No abdominal wall hernia or abnormality. No abdominopelvic
ascites.

Musculoskeletal: No acute or significant osseous findings.
IMPRESSION: 1. Moderate to marked severity left-sided hydronephrosis and
hydroureter without evidence of obstructing renal calculi.
2. Absent right kidney with compensatory hypertrophy of the left
kidney.
3. Subcentimeter nonobstructing renal stones within the left kidney.
4. Mildly dilated small bowel loops within the mid and upper abdomen
which may be transient in nature. An early partial small bowel
obstruction versus ileus cannot be excluded.

## 2024-04-20 ENCOUNTER — Telehealth: Payer: Self-pay | Admitting: Gastroenterology

## 2024-04-20 NOTE — Telephone Encounter (Signed)
 Inbound call from patient stating she needed to reschedule today's pre visit appt.Kristina Sims ahead and rescheduled pt for 8/11 room 50   Please advise  Thank you

## 2024-04-20 NOTE — Telephone Encounter (Signed)
Thanks for letting me know Kristina Sims.

## 2024-04-23 ENCOUNTER — Encounter

## 2024-04-24 ENCOUNTER — Ambulatory Visit (AMBULATORY_SURGERY_CENTER)

## 2024-04-24 ENCOUNTER — Encounter: Payer: Self-pay | Admitting: Gastroenterology

## 2024-04-24 VITALS — Ht 68.0 in | Wt 156.0 lb

## 2024-04-24 DIAGNOSIS — Z1211 Encounter for screening for malignant neoplasm of colon: Secondary | ICD-10-CM

## 2024-04-24 MED ORDER — NA SULFATE-K SULFATE-MG SULF 17.5-3.13-1.6 GM/177ML PO SOLN: 1.0000 | Freq: Once | ORAL | 0 refills | Status: AC

## 2024-04-24 NOTE — Progress Notes (Signed)
 No egg or soy allergy known to patient  No issues known to pt with past sedation with any surgeries or procedures Patient denies ever being told they had issues or difficulty with intubation  No FH of Malignant Hyperthermia Pt is not on diet pills nor GLP-1 medications Pt is not on  home 02  Pt is not on blood thinners  Pt denies issues with chronic constipation  No A fib or A flutter Have any cardiac testing pending--no Pt instructed to use [email or web address] or GoodRx for a price reduction on prep  Ambulates independently

## 2024-05-04 ENCOUNTER — Ambulatory Visit: Admitting: Gastroenterology

## 2024-05-04 ENCOUNTER — Encounter: Payer: Self-pay | Admitting: Gastroenterology

## 2024-05-04 VITALS — BP 126/44 | HR 56 | Temp 97.9°F | Resp 14 | Ht 68.0 in | Wt 156.0 lb

## 2024-05-04 DIAGNOSIS — K644 Residual hemorrhoidal skin tags: Secondary | ICD-10-CM

## 2024-05-04 DIAGNOSIS — K648 Other hemorrhoids: Secondary | ICD-10-CM | POA: Diagnosis not present

## 2024-05-04 DIAGNOSIS — Z1211 Encounter for screening for malignant neoplasm of colon: Secondary | ICD-10-CM | POA: Diagnosis present

## 2024-05-04 DIAGNOSIS — K562 Volvulus: Secondary | ICD-10-CM | POA: Diagnosis not present

## 2024-05-04 MED ORDER — SODIUM CHLORIDE 0.9 % IV SOLN: 500.0000 mL | INTRAVENOUS | Status: DC

## 2024-05-04 NOTE — Op Note (Signed)
 [facility] Patient Name: Kristina Sims Procedure Date: 05/04/2024 3:43 PM MRN: [id number] Endoscopist: Gustav ALONSO Mcgee , MD, [id number] Age: 53 Referring MD:  Date of Birth: 25-Jul-1971 Gender: Female Account #: [id number] Procedure:                Colonoscopy Indications:              Screening for colorectal malignant neoplasm Medicines:                Monitored Anesthesia Care Procedure:                Pre-Anesthesia Assessment:                           - Prior to the procedure, a History and Physical                            was performed, and patient medications and                            allergies were reviewed. The patient's tolerance of                            previous anesthesia was also reviewed. The risks                            and benefits of the procedure and the sedation                            options and risks were discussed with the patient.                            All questions were answered, and informed consent                            was obtained. Prior Anticoagulants: The patient has                            taken no anticoagulant or antiplatelet agents. ASA                            Grade Assessment: II - A patient with mild systemic                            disease. After reviewing the risks and benefits,                            the patient was deemed in satisfactory condition to                            undergo the procedure.                           After obtaining informed consent, the colonoscope  was passed under direct vision. Throughout the                            procedure, the patient's blood pressure, pulse, and                            oxygen saturations were monitored continuously. The                            Olympus Scope SN: [id number] was introduced through                            the anus and advanced to the the cecum, identified                             by appendiceal orifice and ileocecal valve. The                            colonoscopy was somewhat difficult due to                            significant looping. Successful completion of the                            procedure was aided by applying abdominal pressure.                            The patient tolerated the procedure well. The                            quality of the bowel preparation was good. The                            ileocecal valve, appendiceal orifice, and rectum                            were photographed. Scope In: 3:48:53 PM Scope Out: 4:26:20 PM Scope Withdrawal Time: 0 hours 7 minutes 25 seconds  Total Procedure Duration: 0 hours 37 minutes 27 seconds  Findings:                 The perianal and digital rectal examinations were                            normal.                           Non-bleeding external and internal hemorrhoids were                            found during retroflexion. The hemorrhoids were                            medium-sized.  The exam was otherwise without abnormality. Complications:            No immediate complications. Estimated Blood Loss:     Estimated blood loss was minimal. Impression:               - Non-bleeding external and internal hemorrhoids.                           - The examination was otherwise normal.                           - No specimens collected. Recommendation:           - Resume previous diet.                           - Continue present medications.                           - Repeat colonoscopy in 10 years for surveillance. Cailen Mihalik V. Bentli Llorente, MD 05/04/2024 4:30:33 PM This report has been signed electronically.

## 2024-05-04 NOTE — Progress Notes (Signed)
 Pt sedate, gd SR's, VSS, report to RN

## 2024-05-04 NOTE — Progress Notes (Signed)
 Pt's states no medical or surgical changes since previsit or office visit.

## 2024-05-04 NOTE — Progress Notes (Signed)
  Gastroenterology History and Physical   Primary Care Physician:  Debrah Josette ORN., PA-C   Reason for Procedure:  Colorectal cancer screening  Plan:    Screening colonoscopy with possible interventions as needed     HPI: Kristina Sims is a very pleasant 53 y.o. female here for screening colonoscopy. Denies any nausea, vomiting, abdominal pain, melena or bright red blood per rectum  The risks and benefits as well as alternatives of endoscopic procedure(s) have been discussed and reviewed. All questions answered. The patient agrees to proceed.    Past Medical History:  Diagnosis Date   Anxiety    Chronic kidney disease    Renal Agenesis   Depression    Dysrhythmia 2017   stress ,coffee and dehydration   History of kidney stones    Palpitations 11/27/2015   RLD (ruptured lumbar disc)    Solitary kidney     Past Surgical History:  Procedure Laterality Date   BACK SURGERY  2015   L5-S1   COLONOSCOPY     CYSTOSCOPY WITH STENT PLACEMENT Left 12/23/2020   Procedure: PHYLLIS SHADE, WITH LEFT STENT PLACEMENT;  Surgeon: Cam Morene ORN, MD;  Location: [facility];  Service: Urology;  Laterality: Left;   CYSTOSCOPY WITH URETEROSCOPY Left 12/19/2020   Procedure: CYSTOSCOPY WITH URETEROSCOPY;  Surgeon: Watt Rush, MD;  Location: [facility];  Service: Urology;  Laterality: Left;    Prior to Admission medications   Medication Sig Start Date End Date Taking? Authorizing Provider  estradiol (VIVELLE-DOT) 0.05 MG/24HR patch Place 1 patch onto the skin 2 (two) times a week. Fridays & Mondays 11/30/20   [provider]  LORazepam (ATIVAN) 0.5 MG tablet Take 0.5 mg by mouth daily as needed. 01/05/24   [provider]  ondansetron  (ZOFRAN  ODT) 4 MG disintegrating tablet Take 1 tablet (4 mg total) by mouth every 8 (eight) hours as needed for up to 10 doses for nausea or vomiting. Patient not taking: Reported on 04/24/2024 12/04/20   Micky Camellia BROCKS, MD   phenazopyridine  (PYRIDIUM ) 200 MG tablet Take 1 tablet (200 mg total) by mouth 3 (three) times daily as needed for pain. Patient not taking: Reported on 04/24/2024 12/23/20   Cam Morene ORN, MD  progesterone (PROMETRIUM) 200 MG capsule Take 200 mg by mouth daily. 07/05/22   [provider]  sertraline (ZOLOFT) 100 MG tablet Take 100 mg by mouth in the morning.    [provider]  tamsulosin  (FLOMAX ) 0.4 MG CAPS capsule Take 0.4 mg by mouth. Patient not taking: Reported on 04/24/2024    [provider]  traMADol  (ULTRAM ) 50 MG tablet Take 1-2 tablets (50-100 mg total) by mouth every 6 (six) hours as needed for moderate pain. Patient not taking: Reported on 04/24/2024 12/23/20   Cam Morene ORN, MD  tretinoin (RETIN-A) 0.025 % cream Apply topically. 12/25/23   [provider]    Current Outpatient Medications  Medication Sig Dispense Refill   estradiol (VIVELLE-DOT) 0.05 MG/24HR patch Place 1 patch onto the skin 2 (two) times a week. Fridays & Mondays     LORazepam (ATIVAN) 0.5 MG tablet Take 0.5 mg by mouth daily as needed.     ondansetron  (ZOFRAN  ODT) 4 MG disintegrating tablet Take 1 tablet (4 mg total) by mouth every 8 (eight) hours as needed for up to 10 doses for nausea or vomiting. (Patient not taking: Reported on 04/24/2024) 10 tablet 0   phenazopyridine  (PYRIDIUM ) 200 MG tablet Take 1 tablet (200 mg total) by mouth  3 (three) times daily as needed for pain. (Patient not taking: Reported on 04/24/2024) 10 tablet 0   progesterone (PROMETRIUM) 200 MG capsule Take 200 mg by mouth daily.     sertraline (ZOLOFT) 100 MG tablet Take 100 mg by mouth in the morning.     tamsulosin  (FLOMAX ) 0.4 MG CAPS capsule Take 0.4 mg by mouth. (Patient not taking: Reported on 04/24/2024)     traMADol  (ULTRAM ) 50 MG tablet Take 1-2 tablets (50-100 mg total) by mouth every 6 (six) hours as needed for moderate pain. (Patient not taking: Reported on 04/24/2024) 15 tablet 0    tretinoin (RETIN-A) 0.025 % cream Apply topically.     No current facility-administered medications for this visit.    Allergies as of 05/04/2024   (No Known Allergies)    Family History  Problem Relation Age of Onset   Diverticulitis Mother        Had resection   Dementia Mother    Bradycardia Mother    Atrial fibrillation Father    Thyroid disease Father    Peripheral Artery Disease Maternal Grandmother    Alzheimer's disease Maternal Grandfather    Colon polyps Paternal Grandfather    Colon cancer Neg Hx    Esophageal cancer Neg Hx    Rectal cancer Neg Hx    Stomach cancer Neg Hx     Social History   Socioeconomic History   Marital status: Married    Spouse name: Not on file   Number of children: 2   Years of education: Not on file   Highest education level: Not on file  Occupational History   Occupation: Psychologist, counselling  Tobacco Use   Smoking status: Never   Smokeless tobacco: Never  Vaping Use   Vaping status: Never Used  Substance and Sexual Activity   Alcohol use: Yes    Alcohol/week: 7.0 standard drinks of alcohol    Types: 7 Glasses of wine per week    Comment: glass daily   Drug use: No   Sexual activity: Not on file  Other Topics Concern   Not on file  Social History Narrative   Not on file   Social Drivers of Health   Financial Resource Strain: Not on file  Food Insecurity: Unknown (02/07/2024)   Received from [facility]   Hunger Vital Sign    Within the past 12 months, you worried that your food would run out before you got money to buy more: Patient declined to answer    Within the past 12 months, the food you bought just didn't last and you didn't have money to get more. : Patient declined to answer  Transportation Needs: Not on file (02/07/2024)  Physical Activity: Not on file  Stress: Not on file  Social Connections: Not on file  Intimate Partner Violence: Not on file    Review of Systems:  All other review of systems negative  except as mentioned in the HPI.  Physical Exam: Vital signs in last 24 hours: There were no vitals taken for this visit. General:   Alert, NAD Lungs:  Clear .   Heart:  Regular rate and rhythm Abdomen:  Soft, nontender and nondistended. Neuro/Psych:  Alert and cooperative. Normal mood and affect. A and O x 3  Reviewed labs, radiology imaging, old records and pertinent past GI work up  Patient is appropriate for planned procedure(s) and anesthesia in an ambulatory setting   K. Veena Icy Fuhrmann , MD [phone]

## 2024-05-04 NOTE — Patient Instructions (Signed)
 YOU HAD AN ENDOSCOPIC PROCEDURE TODAY AT THE [facility]:   Refer to the procedure report that was given to you for any specific questions about what was found during the examination.  If the procedure report does not answer your questions, please call your gastroenterologist to clarify.  If you requested that your care partner not be given the details of your procedure findings, then the procedure report has been included in a sealed envelope for you to review at your convenience later.  YOU SHOULD EXPECT: Some feelings of bloating in the abdomen. Passage of more gas than usual.  Walking can help get rid of the air that was put into your GI tract during the procedure and reduce the bloating. If you had a lower endoscopy (such as a colonoscopy or flexible sigmoidoscopy) you may notice spotting of blood in your stool or on the toilet paper. If you underwent a bowel prep for your procedure, you may not have a normal bowel movement for a few days.  Please Note:  You might notice some irritation and congestion in your nose or some drainage.  This is from the oxygen used during your procedure.  There is no need for concern and it should clear up in a day or so.  SYMPTOMS TO REPORT IMMEDIATELY:  Following lower endoscopy (colonoscopy or flexible sigmoidoscopy):  Excessive amounts of blood in the stool  Significant tenderness or worsening of abdominal pains  Swelling of the abdomen that is new, acute  Fever of 100F or higher  Resume previous diet Continue present medications Repeat colonoscopy in 10 years   For urgent or emergent issues, a gastroenterologist can be reached at any hour by calling [phone]. Do not use MyChart messaging for urgent concerns.    DIET:  We do recommend a small meal at first, but then you may proceed to your regular diet.  Drink plenty of fluids but you should avoid alcoholic beverages for 24 hours.  ACTIVITY:  You should plan to take it easy for the  rest of today and you should NOT DRIVE or use heavy machinery until tomorrow (because of the sedation medicines used during the test).    FOLLOW UP: Our staff will call the number listed on your records the next business day following your procedure.  We will call around 7:15- 8:00 am to check on you and address any questions or concerns that you may have regarding the information given to you following your procedure. If we do not reach you, we will leave a message.     If any biopsies were taken you will be contacted by phone or by letter within the next 1-3 weeks.  Please call us  at [phone] if you have not heard about the biopsies in 3 weeks.    SIGNATURES/CONFIDENTIALITY: You and/or your care partner have signed paperwork which will be entered into your electronic medical record.  These signatures attest to the fact that that the information above on your After Visit Summary has been reviewed and is understood.  Full responsibility of the confidentiality of this discharge information lies with you and/or your care-partner.
